# Patient Record
Sex: Female | Born: 1965 | Race: White | Hispanic: No | Marital: Married | Smoking: Never smoker
Health system: Southern US, Community
[De-identification: ages and names within clinical notes are randomized; demographics above are authoritative.]

## PROBLEM LIST (undated history)

## (undated) DIAGNOSIS — I4510 Unspecified right bundle-branch block: Secondary | ICD-10-CM

## (undated) HISTORY — DX: Unspecified right bundle-branch block: I45.10

---
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
## 2005-10-29 ENCOUNTER — Other Ambulatory Visit: Admission: RE | Admit: 2005-10-29 | Discharge: 2005-10-29 | Payer: Self-pay | Admitting: Gynecology

## 2005-11-05 ENCOUNTER — Ambulatory Visit (HOSPITAL_COMMUNITY): Admission: RE | Admit: 2005-11-05 | Discharge: 2005-11-05 | Payer: Self-pay | Admitting: Gynecology

## 2006-10-28 ENCOUNTER — Emergency Department (HOSPITAL_COMMUNITY): Admission: EM | Admit: 2006-10-28 | Discharge: 2006-10-28 | Payer: Self-pay | Admitting: Family Medicine

## 2013-01-15 ENCOUNTER — Ambulatory Visit (INDEPENDENT_AMBULATORY_CARE_PROVIDER_SITE_OTHER): Payer: BC Managed Care – PPO | Admitting: Obstetrics and Gynecology

## 2013-01-15 ENCOUNTER — Encounter: Payer: Self-pay | Admitting: Obstetrics and Gynecology

## 2013-01-15 VITALS — BP 100/63 | HR 79 | Ht 61.0 in | Wt 140.0 lb

## 2013-01-15 DIAGNOSIS — Z01419 Encounter for gynecological examination (general) (routine) without abnormal findings: Secondary | ICD-10-CM

## 2013-01-15 DIAGNOSIS — Z1151 Encounter for screening for human papillomavirus (HPV): Secondary | ICD-10-CM

## 2013-01-15 DIAGNOSIS — Z124 Encounter for screening for malignant neoplasm of cervix: Secondary | ICD-10-CM

## 2013-01-15 MED ORDER — BUPROPION HCL ER (XL) 300 MG PO TB24: 300.0000 mg | ORAL_TABLET | Freq: Every day | ORAL | Status: DC

## 2013-01-15 NOTE — Progress Notes (Signed)
  Subjective:     Sierra Haney is a 47 y.o. female and is here for a comprehensive physical exam. The patient reports decreased libido. She has been menopausal since 2011 and over the past year has noted a decrease in her libido. She has no desire for sexual intercourse but notices that it is causing a strain in her relationship with her husband of 30 years. Patient otherwise without complaints.   History   Social History  . Marital Status: Married    Spouse Name: N/A    Number of Children: N/A  . Years of Education: N/A   Occupational History  . Not on file.   Social History Main Topics  . Smoking status: Never Smoker   . Smokeless tobacco: Not on file  . Alcohol Use: No  . Drug Use: No  . Sexually Active: Yes -- Female partner(s)    Birth Control/ Protection: Post-menopausal   Other Topics Concern  . Not on file   Social History Narrative  . No narrative on file   History reviewed. No pertinent past medical history. History reviewed. No pertinent past surgical history. Family History  Problem Relation Age of Onset  . Hypertension Mother   . Hypertension Father   . Diabetes Father    History  Substance Use Topics  . Smoking status: Never Smoker   . Smokeless tobacco: Not on file  . Alcohol Use: No    No health maintenance topics applied.     Review of Systems A comprehensive review of systems was negative.   Objective:      GENERAL: Well-developed, well-nourished female in no acute distress.  HEENT: Normocephalic, atraumatic. Sclerae anicteric.  NECK: Supple. Normal thyroid.  LUNGS: Clear to auscultation bilaterally.  HEART: Regular rate and rhythm. BREASTS: Symmetric in size. No palpable masses or lymphadenopathy, skin changes, or nipple drainage. ABDOMEN: Soft, nontender, nondistended. No organomegaly. PELVIC: Normal external female genitalia. Vagina is pink and rugated.  Normal discharge. Normal appearing cervix. Uterus is normal in size. No adnexal mass or  tenderness. EXTREMITIES: No cyanosis, clubbing, or edema, 2+ distal pulses.    Assessment:    Healthy female exam.      Plan:    Pap smear collected Screening mammography scheduled Rx wellbutirn provided Samples of Osphena also provided RTC prn See After Visit Summary for Counseling Recommendations

## 2013-01-15 NOTE — Progress Notes (Signed)
Patient is having decrease sex drive and is currently in menopause.

## 2013-01-15 NOTE — Patient Instructions (Signed)
Preventive Care for Adults, Female A healthy lifestyle and preventive care can promote health and wellness. Preventive health guidelines for women include the following key practices.  A routine yearly physical is a good way to check with your caregiver about your health and preventive screening. It is a chance to share any concerns and updates on your health, and to receive a thorough exam.  Visit your dentist for a routine exam and preventive care every 6 months. Brush your teeth twice a day and floss once a day. Good oral hygiene prevents tooth decay and gum disease.  The frequency of eye exams is based on your age, health, family medical history, use of contact lenses, and other factors. Follow your caregiver's recommendations for frequency of eye exams.  Eat a healthy diet. Foods like vegetables, fruits, whole grains, low-fat dairy products, and lean protein foods contain the nutrients you need without too many calories. Decrease your intake of foods high in solid fats, added sugars, and salt. Eat the right amount of calories for you.Get information about a proper diet from your caregiver, if necessary.  Regular physical exercise is one of the most important things you can do for your health. Most adults should get at least 150 minutes of moderate-intensity exercise (any activity that increases your heart rate and causes you to sweat) each week. In addition, most adults need muscle-strengthening exercises on 2 or more days a week.  Maintain a healthy weight. The body mass index (BMI) is a screening tool to identify possible weight problems. It provides an estimate of body fat based on height and weight. Your caregiver can help determine your BMI, and can help you achieve or maintain a healthy weight.For adults 20 years and older:  A BMI below 18.5 is considered underweight.  A BMI of 18.5 to 24.9 is normal.  A BMI of 25 to 29.9 is considered overweight.  A BMI of 30 and above is  considered obese.  Maintain normal blood lipids and cholesterol levels by exercising and minimizing your intake of saturated fat. Eat a balanced diet with plenty of fruit and vegetables. Blood tests for lipids and cholesterol should begin at age 20 and be repeated every 5 years. If your lipid or cholesterol levels are high, you are over 50, or you are at high risk for heart disease, you may need your cholesterol levels checked more frequently.Ongoing high lipid and cholesterol levels should be treated with medicines if diet and exercise are not effective.  If you smoke, find out from your caregiver how to quit. If you do not use tobacco, do not start.  If you are pregnant, do not drink alcohol. If you are breastfeeding, be very cautious about drinking alcohol. If you are not pregnant and choose to drink alcohol, do not exceed 1 drink per day. One drink is considered to be 12 ounces (355 mL) of beer, 5 ounces (148 mL) of wine, or 1.5 ounces (44 mL) of liquor.  Avoid use of street drugs. Do not share needles with anyone. Ask for help if you need support or instructions about stopping the use of drugs.  High blood pressure causes heart disease and increases the risk of stroke. Your blood pressure should be checked at least every 1 to 2 years. Ongoing high blood pressure should be treated with medicines if weight loss and exercise are not effective.  If you are 55 to 49 years old, ask your caregiver if you should take aspirin to prevent strokes.  Diabetes   screening involves taking a blood sample to check your fasting blood sugar level. This should be done once every 3 years, after age 45, if you are within normal weight and without risk factors for diabetes. Testing should be considered at a younger age or be carried out more frequently if you are overweight and have at least 1 risk factor for diabetes.  Breast cancer screening is essential preventive care for women. You should practice "breast  self-awareness." This means understanding the normal appearance and feel of your breasts and may include breast self-examination. Any changes detected, no matter how small, should be reported to a caregiver. Women in their 20s and 30s should have a clinical breast exam (CBE) by a caregiver as part of a regular health exam every 1 to 3 years. After age 40, women should have a CBE every year. Starting at age 40, women should consider having a mammography (breast X-ray test) every year. Women who have a family history of breast cancer should talk to their caregiver about genetic screening. Women at a high risk of breast cancer should talk to their caregivers about having magnetic resonance imaging (MRI) and a mammography every year.  The Pap test is a screening test for cervical cancer. A Pap test can show cell changes on the cervix that might become cervical cancer if left untreated. A Pap test is a procedure in which cells are obtained and examined from the lower end of the uterus (cervix).  Women should have a Pap test starting at age 21.  Between ages 21 and 29, Pap tests should be repeated every 2 years.  Beginning at age 30, you should have a Pap test every 3 years as long as the past 3 Pap tests have been normal.  Some women have medical problems that increase the chance of getting cervical cancer. Talk to your caregiver about these problems. It is especially important to talk to your caregiver if a new problem develops soon after your last Pap test. In these cases, your caregiver may recommend more frequent screening and Pap tests.  The above recommendations are the same for women who have or have not gotten the vaccine for human papillomavirus (HPV).  If you had a hysterectomy for a problem that was not cancer or a condition that could lead to cancer, then you no longer need Pap tests. Even if you no longer need a Pap test, a regular exam is a good idea to make sure no other problems are  starting.  If you are between ages 65 and 70, and you have had normal Pap tests going back 10 years, you no longer need Pap tests. Even if you no longer need a Pap test, a regular exam is a good idea to make sure no other problems are starting.  If you have had past treatment for cervical cancer or a condition that could lead to cancer, you need Pap tests and screening for cancer for at least 20 years after your treatment.  If Pap tests have been discontinued, risk factors (such as a new sexual partner) need to be reassessed to determine if screening should be resumed.  The HPV test is an additional test that may be used for cervical cancer screening. The HPV test looks for the virus that can cause the cell changes on the cervix. The cells collected during the Pap test can be tested for HPV. The HPV test could be used to screen women aged 30 years and older, and should   be used in women of any age who have unclear Pap test results. After the age of 30, women should have HPV testing at the same frequency as a Pap test.  Colorectal cancer can be detected and often prevented. Most routine colorectal cancer screening begins at the age of 50 and continues through age 75. However, your caregiver may recommend screening at an earlier age if you have risk factors for colon cancer. On a yearly basis, your caregiver may provide home test kits to check for hidden blood in the stool. Use of a small camera at the end of a tube, to directly examine the colon (sigmoidoscopy or colonoscopy), can detect the earliest forms of colorectal cancer. Talk to your caregiver about this at age 50, when routine screening begins. Direct examination of the colon should be repeated every 5 to 10 years through age 75, unless early forms of pre-cancerous polyps or small growths are found.  Hepatitis C blood testing is recommended for all people born from 1945 through 1965 and any individual with known risks for hepatitis C.  Practice  safe sex. Use condoms and avoid high-risk sexual practices to reduce the spread of sexually transmitted infections (STIs). STIs include gonorrhea, chlamydia, syphilis, trichomonas, herpes, HPV, and human immunodeficiency virus (HIV). Herpes, HIV, and HPV are viral illnesses that have no cure. They can result in disability, cancer, and death. Sexually active women aged 25 and younger should be checked for chlamydia. Older women with new or multiple partners should also be tested for chlamydia. Testing for other STIs is recommended if you are sexually active and at increased risk.  Osteoporosis is a disease in which the bones lose minerals and strength with aging. This can result in serious bone fractures. The risk of osteoporosis can be identified using a bone density scan. Women ages 65 and over and women at risk for fractures or osteoporosis should discuss screening with their caregivers. Ask your caregiver whether you should take a calcium supplement or vitamin D to reduce the rate of osteoporosis.  Menopause can be associated with physical symptoms and risks. Hormone replacement therapy is available to decrease symptoms and risks. You should talk to your caregiver about whether hormone replacement therapy is right for you.  Use sunscreen with sun protection factor (SPF) of 30 or more. Apply sunscreen liberally and repeatedly throughout the day. You should seek shade when your shadow is shorter than you. Protect yourself by wearing long sleeves, pants, a wide-brimmed hat, and sunglasses year round, whenever you are outdoors.  Once a month, do a whole body skin exam, using a mirror to look at the skin on your back. Notify your caregiver of new moles, moles that have irregular borders, moles that are larger than a pencil eraser, or moles that have changed in shape or color.  Stay current with required immunizations.  Influenza. You need a dose every fall (or winter). The composition of the flu vaccine  changes each year, so being vaccinated once is not enough.  Pneumococcal polysaccharide. You need 1 to 2 doses if you smoke cigarettes or if you have certain chronic medical conditions. You need 1 dose at age 65 (or older) if you have never been vaccinated.  Tetanus, diphtheria, pertussis (Tdap, Td). Get 1 dose of Tdap vaccine if you are younger than age 65, are over 65 and have contact with an infant, are a healthcare worker, are pregnant, or simply want to be protected from whooping cough. After that, you need a Td   booster dose every 10 years. Consult your caregiver if you have not had at least 3 tetanus and diphtheria-containing shots sometime in your life or have a deep or dirty wound.  HPV. You need this vaccine if you are a woman age 26 or younger. The vaccine is given in 3 doses over 6 months.  Measles, mumps, rubella (MMR). You need at least 1 dose of MMR if you were born in 1957 or later. You may also need a second dose.  Meningococcal. If you are age 19 to 21 and a first-year college student living in a residence hall, or have one of several medical conditions, you need to get vaccinated against meningococcal disease. You may also need additional booster doses.  Zoster (shingles). If you are age 60 or older, you should get this vaccine.  Varicella (chickenpox). If you have never had chickenpox or you were vaccinated but received only 1 dose, talk to your caregiver to find out if you need this vaccine.  Hepatitis A. You need this vaccine if you have a specific risk factor for hepatitis A virus infection or you simply wish to be protected from this disease. The vaccine is usually given as 2 doses, 6 to 18 months apart.  Hepatitis B. You need this vaccine if you have a specific risk factor for hepatitis B virus infection or you simply wish to be protected from this disease. The vaccine is given in 3 doses, usually over 6 months. Preventive Services / Frequency Ages 19 to 39  Blood  pressure check.** / Every 1 to 2 years.  Lipid and cholesterol check.** / Every 5 years beginning at age 20.  Clinical breast exam.** / Every 3 years for women in their 20s and 30s.  Pap test.** / Every 2 years from ages 21 through 29. Every 3 years starting at age 30 through age 65 or 70 with a history of 3 consecutive normal Pap tests.  HPV screening.** / Every 3 years from ages 30 through ages 65 to 70 with a history of 3 consecutive normal Pap tests.  Hepatitis C blood test.** / For any individual with known risks for hepatitis C.  Skin self-exam. / Monthly.  Influenza immunization.** / Every year.  Pneumococcal polysaccharide immunization.** / 1 to 2 doses if you smoke cigarettes or if you have certain chronic medical conditions.  Tetanus, diphtheria, pertussis (Tdap, Td) immunization. / A one-time dose of Tdap vaccine. After that, you need a Td booster dose every 10 years.  HPV immunization. / 3 doses over 6 months, if you are 26 and younger.  Measles, mumps, rubella (MMR) immunization. / You need at least 1 dose of MMR if you were born in 1957 or later. You may also need a second dose.  Meningococcal immunization. / 1 dose if you are age 19 to 21 and a first-year college student living in a residence hall, or have one of several medical conditions, you need to get vaccinated against meningococcal disease. You may also need additional booster doses.  Varicella immunization.** / Consult your caregiver.  Hepatitis A immunization.** / Consult your caregiver. 2 doses, 6 to 18 months apart.  Hepatitis B immunization.** / Consult your caregiver. 3 doses usually over 6 months. Ages 40 to 64  Blood pressure check.** / Every 1 to 2 years.  Lipid and cholesterol check.** / Every 5 years beginning at age 20.  Clinical breast exam.** / Every year after age 40.  Mammogram.** / Every year beginning at age 40   and continuing for as long as you are in good health. Consult with your  caregiver.  Pap test.** / Every 3 years starting at age 30 through age 65 or 70 with a history of 3 consecutive normal Pap tests.  HPV screening.** / Every 3 years from ages 30 through ages 65 to 70 with a history of 3 consecutive normal Pap tests.  Fecal occult blood test (FOBT) of stool. / Every year beginning at age 50 and continuing until age 75. You may not need to do this test if you get a colonoscopy every 10 years.  Flexible sigmoidoscopy or colonoscopy.** / Every 5 years for a flexible sigmoidoscopy or every 10 years for a colonoscopy beginning at age 50 and continuing until age 75.  Hepatitis C blood test.** / For all people born from 1945 through 1965 and any individual with known risks for hepatitis C.  Skin self-exam. / Monthly.  Influenza immunization.** / Every year.  Pneumococcal polysaccharide immunization.** / 1 to 2 doses if you smoke cigarettes or if you have certain chronic medical conditions.  Tetanus, diphtheria, pertussis (Tdap, Td) immunization.** / A one-time dose of Tdap vaccine. After that, you need a Td booster dose every 10 years.  Measles, mumps, rubella (MMR) immunization. / You need at least 1 dose of MMR if you were born in 1957 or later. You may also need a second dose.  Varicella immunization.** / Consult your caregiver.  Meningococcal immunization.** / Consult your caregiver.  Hepatitis A immunization.** / Consult your caregiver. 2 doses, 6 to 18 months apart.  Hepatitis B immunization.** / Consult your caregiver. 3 doses, usually over 6 months. Ages 65 and over  Blood pressure check.** / Every 1 to 2 years.  Lipid and cholesterol check.** / Every 5 years beginning at age 20.  Clinical breast exam.** / Every year after age 40.  Mammogram.** / Every year beginning at age 40 and continuing for as long as you are in good health. Consult with your caregiver.  Pap test.** / Every 3 years starting at age 30 through age 65 or 70 with a 3  consecutive normal Pap tests. Testing can be stopped between 65 and 70 with 3 consecutive normal Pap tests and no abnormal Pap or HPV tests in the past 10 years.  HPV screening.** / Every 3 years from ages 30 through ages 65 or 70 with a history of 3 consecutive normal Pap tests. Testing can be stopped between 65 and 70 with 3 consecutive normal Pap tests and no abnormal Pap or HPV tests in the past 10 years.  Fecal occult blood test (FOBT) of stool. / Every year beginning at age 50 and continuing until age 75. You may not need to do this test if you get a colonoscopy every 10 years.  Flexible sigmoidoscopy or colonoscopy.** / Every 5 years for a flexible sigmoidoscopy or every 10 years for a colonoscopy beginning at age 50 and continuing until age 75.  Hepatitis C blood test.** / For all people born from 1945 through 1965 and any individual with known risks for hepatitis C.  Osteoporosis screening.** / A one-time screening for women ages 65 and over and women at risk for fractures or osteoporosis.  Skin self-exam. / Monthly.  Influenza immunization.** / Every year.  Pneumococcal polysaccharide immunization.** / 1 dose at age 65 (or older) if you have never been vaccinated.  Tetanus, diphtheria, pertussis (Tdap, Td) immunization. / A one-time dose of Tdap vaccine if you are over   65 and have contact with an infant, are a healthcare worker, or simply want to be protected from whooping cough. After that, you need a Td booster dose every 10 years.  Varicella immunization.** / Consult your caregiver.  Meningococcal immunization.** / Consult your caregiver.  Hepatitis A immunization.** / Consult your caregiver. 2 doses, 6 to 18 months apart.  Hepatitis B immunization.** / Check with your caregiver. 3 doses, usually over 6 months. ** Family history and personal history of risk and conditions may change your caregiver's recommendations. Document Released: 07/31/2001 Document Revised: 08/27/2011  Document Reviewed: 10/30/2010 [employer] Patient Information 2014 [employer].  

## 2013-02-08 ENCOUNTER — Other Ambulatory Visit: Payer: Self-pay | Admitting: Obstetrics and Gynecology

## 2013-04-16 ENCOUNTER — Encounter (HOSPITAL_COMMUNITY): Payer: Self-pay | Admitting: Emergency Medicine

## 2013-04-16 ENCOUNTER — Emergency Department (HOSPITAL_COMMUNITY)
Admission: EM | Admit: 2013-04-16 | Discharge: 2013-04-16 | Disposition: A | Discharge status: Nursing Home (Includes ICF) | Payer: BC Managed Care – PPO | Source: Home / Self Care

## 2013-04-16 ENCOUNTER — Observation Stay (HOSPITAL_COMMUNITY)
Admission: EM | Admit: 2013-04-16 | Discharge: 2013-04-17 | Disposition: A | Discharge status: Home / Self Care | Payer: BC Managed Care – PPO | Attending: Cardiology | Admitting: Cardiology

## 2013-04-16 ENCOUNTER — Emergency Department (HOSPITAL_COMMUNITY): Payer: BC Managed Care – PPO

## 2013-04-16 DIAGNOSIS — IMO0001 Reserved for inherently not codable concepts without codable children: Secondary | ICD-10-CM | POA: Insufficient documentation

## 2013-04-16 DIAGNOSIS — R509 Fever, unspecified: Secondary | ICD-10-CM | POA: Insufficient documentation

## 2013-04-16 DIAGNOSIS — R5381 Other malaise: Secondary | ICD-10-CM | POA: Insufficient documentation

## 2013-04-16 DIAGNOSIS — R0789 Other chest pain: Principal | ICD-10-CM | POA: Insufficient documentation

## 2013-04-16 DIAGNOSIS — J069 Acute upper respiratory infection, unspecified: Secondary | ICD-10-CM

## 2013-04-16 DIAGNOSIS — R059 Cough, unspecified: Secondary | ICD-10-CM | POA: Insufficient documentation

## 2013-04-16 DIAGNOSIS — R05 Cough: Secondary | ICD-10-CM | POA: Insufficient documentation

## 2013-04-16 DIAGNOSIS — I4510 Unspecified right bundle-branch block: Secondary | ICD-10-CM

## 2013-04-16 DIAGNOSIS — R079 Chest pain, unspecified: Secondary | ICD-10-CM

## 2013-04-16 DIAGNOSIS — J3489 Other specified disorders of nose and nasal sinuses: Secondary | ICD-10-CM | POA: Insufficient documentation

## 2013-04-16 LAB — CBC
HCT: 44.1 % (ref 36.0–46.0)
Hemoglobin: 15.3 g/dL — ABNORMAL HIGH (ref 12.0–15.0)
Platelets: 163 10*3/uL (ref 150–400)
RBC: 5.15 MIL/uL — ABNORMAL HIGH (ref 3.87–5.11)
WBC: 6.9 10*3/uL (ref 4.0–10.5)

## 2013-04-16 LAB — COMPREHENSIVE METABOLIC PANEL
Alkaline Phosphatase: 66 U/L (ref 39–117)
CO2: 25 mEq/L (ref 19–32)
Creatinine, Ser: 0.56 mg/dL (ref 0.50–1.10)
GFR calc Af Amer: >90 mL/min (ref >90)
Potassium: 4.3 mEq/L (ref 3.5–5.1)
Sodium: 139 mEq/L (ref 135–145)
Total Bilirubin: 0.3 mg/dL (ref 0.3–1.2)
Total Protein: 7.3 g/dL (ref 6.0–8.3)

## 2013-04-16 LAB — POCT I-STAT TROPONIN I: Troponin i, poc: 0 ng/mL (ref 0.00–0.08)

## 2013-04-16 MED ORDER — SODIUM CHLORIDE 0.9 % IV SOLN: 1000.0000 mL | INTRAVENOUS | Status: DC

## 2013-04-16 MED ORDER — ACETAMINOPHEN 325 MG PO TABS: 650.0000 mg | ORAL_TABLET | ORAL | Status: DC | PRN

## 2013-04-16 MED ORDER — SODIUM CHLORIDE 0.9 % IV SOLN: 250.0000 mL | INTRAVENOUS | Status: DC | PRN

## 2013-04-16 MED ORDER — SODIUM CHLORIDE 0.9 % IV SOLN
Freq: Once | INTRAVENOUS | Status: AC
  Administered 2013-04-16: 12:00:00 via INTRAVENOUS

## 2013-04-16 MED ORDER — ASPIRIN EC 81 MG PO TBEC
81.0000 mg | DELAYED_RELEASE_TABLET | Freq: Every day | ORAL | Status: DC
  Filled 2013-04-16: qty 1

## 2013-04-16 MED ORDER — SODIUM CHLORIDE 0.9 % IJ SOLN
3.0000 mL | Freq: Two times a day (BID) | INTRAMUSCULAR | Status: DC
  Administered 2013-04-16: 3 mL via INTRAVENOUS

## 2013-04-16 MED ORDER — ZOLPIDEM TARTRATE 5 MG PO TABS: 5.0000 mg | ORAL_TABLET | Freq: Every evening | ORAL | Status: DC | PRN

## 2013-04-16 MED ORDER — ALPRAZOLAM 0.25 MG PO TABS: 0.2500 mg | ORAL_TABLET | Freq: Two times a day (BID) | ORAL | Status: DC | PRN

## 2013-04-16 MED ORDER — NITROGLYCERIN 0.4 MG SL SUBL: 0.4000 mg | SUBLINGUAL_TABLET | SUBLINGUAL | Status: DC | PRN

## 2013-04-16 MED ORDER — HEPARIN SODIUM (PORCINE) 5000 UNIT/ML IJ SOLN
5000.0000 [IU] | Freq: Three times a day (TID) | INTRAMUSCULAR | Status: DC
  Filled 2013-04-16 (×5): qty 1

## 2013-04-16 MED ORDER — SODIUM CHLORIDE 0.9 % IJ SOLN: 3.0000 mL | INTRAMUSCULAR | Status: DC | PRN

## 2013-04-16 MED ORDER — ASPIRIN 81 MG PO CHEW
324.0000 mg | CHEWABLE_TABLET | Freq: Once | ORAL | Status: AC
  Administered 2013-04-16: 324 mg via ORAL
  Filled 2013-04-16: qty 4

## 2013-04-16 MED ORDER — ONDANSETRON HCL 4 MG/2ML IJ SOLN: 4.0000 mg | Freq: Four times a day (QID) | INTRAMUSCULAR | Status: DC | PRN

## 2013-04-16 MED ORDER — BUPROPION HCL ER (XL) 300 MG PO TB24
300.0000 mg | ORAL_TABLET | Freq: Every day | ORAL | Status: DC
  Filled 2013-04-16 (×2): qty 1

## 2013-04-16 NOTE — ED Notes (Signed)
Lab at bedside to redraw I stat troponin.

## 2013-04-16 NOTE — ED Notes (Signed)
Pt eating dinner.  Will transport to 3W momentarily.

## 2013-04-16 NOTE — ED Notes (Signed)
Placed    On cardiac  Monitor  Nasal  o2  At  2  l  /  min 

## 2013-04-16 NOTE — ED Provider Notes (Signed)
CSN: [id number]     Arrival date & time 04/16/13  1223 History   First MD Initiated Contact with Patient 04/16/13 1233     Chief Complaint  Patient presents with  . Chest Pain    HPI Pt started having body aches, nasal congestion, fever and myalgias that started 2 days ago. 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   She has had a dry cough.  She went to the urgent care today and was sent down to the ED because she had mentioned chest pressure with her symptoms.  Her chest feels like an intermittent pressure like an elephant sitting on her chest.  That has been ongoing for months but she is not really sure.  She notices it when she gets on the treadmill and it increases with exertion but she does not have to stop.  She did have some earlier today but it has been a daily thing at this point for a few months and maybe even longer.  Today however, with the first time she pain while at rest.  No pain now. No history of heart problems, no HTN.  No diabetes.  NO smoking.   History reviewed. No pertinent past medical history. History reviewed. No pertinent past surgical history. Family History  Problem Relation Age of Onset  . Hypertension Mother   . Hypertension Father   . Diabetes Father   . Asthma Mother   . Cancer Father   . Heart attack Mother     age 67  . Hypertension Sister    History  Substance Use Topics  . Smoking status: Never Smoker   . Smokeless tobacco: Not on file  . Alcohol Use: No   OB History   Grav Para Term Preterm Abortions TAB SAB Ect Mult Living                 Review of Systems  All other systems reviewed and are negative.    Allergies  Review of patient's allergies indicates no known allergies.  Home Medications   Current Outpatient Rx  Name  Route  Sig  Dispense  Refill  . acetaminophen (TYLENOL) 500 MG tablet   Oral   Take 1,000 mg by mouth every 6 (six) hours as needed for pain.         . Chlorphen-Phenyleph-ASA (ALKA-SELTZER PLUS  COLD PO)   Oral   Take 2 tablets by mouth 2 (two) times daily as needed (cold symptoms).         . Pseudoeph-Doxylamine-DM-APAP (NYQUIL PO)   Oral   Take 15 mLs by mouth at bedtime as needed (for cold symptoms).          BP 114/70  Pulse 71  Temp(Src) 98.6 F (37 C) (Oral)  Resp 20  Ht 5\' 1"  (1.549 m)  Wt 131 lb (59.421 kg)  BMI 24.76 kg/m2  SpO2 99%  LMP 08/16/2009 Physical Exam  Nursing note and vitals reviewed. Constitutional: She appears well-developed and well-nourished. No distress.  HENT:  Head: Normocephalic and atraumatic.  Right Ear: External ear normal.  Left Ear: External ear normal.  Eyes: Conjunctivae are normal. Right eye exhibits no discharge. Left eye exhibits no discharge. No scleral icterus.  Neck: Neck supple. No tracheal deviation present.  Cardiovascular: Normal rate, regular rhythm and intact distal pulses.   Pulmonary/Chest: Effort normal and breath sounds normal. No stridor. No respiratory distress. She has no wheezes. She has no rales.  Abdominal: Soft. Bowel sounds are  normal. She exhibits no distension. There is no tenderness. There is no rebound and no guarding.  Musculoskeletal: She exhibits no edema and no tenderness.  Neurological: She is alert. She has normal strength. No sensory deficit. Cranial nerve deficit:  no gross defecits noted. She exhibits normal muscle tone. She displays no seizure activity. Coordination normal.  Skin: Skin is warm and dry. No rash noted.  Psychiatric: She has a normal mood and affect.    ED Course  Procedures (including critical care time) Labs Review Labs Reviewed  CBC - Abnormal; Notable for the following:    RBC 5.15 (*)    Hemoglobin 15.3 (*)    All other components within normal limits  COMPREHENSIVE METABOLIC PANEL  TROPONIN I  TROPONIN I  TROPONIN I  TSH  T4, FREE  HEMOGLOBIN A1C  BASIC METABOLIC PANEL  LIPID PANEL  POCT I-STAT TROPONIN I   Imaging Review CXR interpretation  reviewed  EKG Interpretation     Ventricular Rate:  68 PR Interval:  128 QRS Duration: 137 QT Interval:  435 QTC Calculation: 463 R Axis:   99 Text Interpretation:  Sinus rhythm RBBB and LPFB No significant change since last tracing            MDM   1. Chest tightness or pressure   2. Chest pain      The patient's URI symptoms are most likely related to a viral infection.  She has however mentioned symptoms that could be related to exercise induced angina in that she has chest pressure with exercise.  The episode on the day of her evaluation occurred at rest.  Plan was to consult cardiology regarding her chest pressure complaint ie stress testing.   Celene Kras, MD 04/18/13 [phone]

## 2013-04-16 NOTE — ED Notes (Addendum)
Pt  Reports    Symptoms  Of body  Aches    To  Include  Chills        And  Cough x  2  Days    She  Reports  The  Cough is     Non  Productive       She  Is  sittingupright on  Exam table  Speaking in  Complete  sentances      Pt   Also  Reports  A  Sensation of  heavyness  In  Her  chest

## 2013-04-16 NOTE — H&P (Signed)
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 During a review of systems patient reported 1-2 years of anterior chest heaviness, "like an elephant sitting on my chest". She experiences this everyday during exertion at work and it lasts approx 2-3 minutes. Nothing seems to makes it better or worse. She states that she "just ignores it and it eventually goes away."  She denies diaphoresis, shortness of breath, dyspnea, pain with radiation to jaw or arm, dizziness, lightheadedness, syncope, palpitations, nausea, GI upset, heartburn or abdominal pain. Pain is not positional or worse with inspiration. She is a nonsmoker and does not drink or take any drugs. The only medications she is on are Nyquil and Leota Sauers for current symptoms. She has a family history of htn in her mother and father. Her mother died at 52 from MI.  She had an EKG at the urgent care showing a RBBB. In the setting of recurring chest pain the NP urged her to go to the ED.   Allergies:   No Known Allergies    Home Medications  Prior to Admission medications   Medication Sig Start Date End Date Taking? Authorizing Provider  acetaminophen (TYLENOL) 500 MG tablet Take 1,000 mg by mouth every 6 (six) hours as needed for pain.   Yes Historical Provider, MD  Chlorphen-Phenyleph-ASA (ALKA-SELTZER PLUS COLD PO) Take 2 tablets by mouth 2 (two) times daily as needed (cold symptoms).   Yes Historical Provider, MD  Pseudoeph-Doxylamine-DM-APAP (NYQUIL PO) Take 15 mLs by mouth at bedtime as needed (for cold symptoms).   Yes Historical Provider, MD    Family History  Family History  Problem  Relation Age of Onset  . Hypertension Mother   . Hypertension Father   . Diabetes Father   . Asthma Mother   . Cancer Father   . Heart attack Mother     age 58  . Hypertension Sister     Social History  History   Social History  . Marital Status: Married    Spouse Name: Max    Number of Children: 1  . Years of Education: N/A   Occupational History  . kindergarden teacher [employer]   Social History Main Topics  . Smoking status: Never Smoker   . Smokeless tobacco: Not on file  . Alcohol Use: No  . Drug Use: No  . Sexual Activity: Yes    Partners: Male    Birth Control/ Protection: Post-menopausal   Other Topics Concern  . Not on file   Social History Narrative  . No narrative on file     Review of Systems General:  No chills, + fever, night sweats or  20lb intentional weight loss with diet and exercise.  Cardiovascular:  No chest pain, dyspnea on exertion, edema, orthopnea, palpitations, paroxysmal nocturnal dyspnea. Dermatological: No rash, lesions/masses Respiratory: + cough, No dyspnea Urologic: No hematuria, dysuria Abdominal:   No nausea, vomiting, diarrhea, bright red blood per rectum, melena, or hematemesis Neurologic:  No visual changes, wkns, changes in mental status. All other systems reviewed and are otherwise negative except as noted above.  Physical Exam  Blood pressure 123/71, pulse 75, temperature 97.7 F (36.5 C), temperature  source Oral, resp. rate 14, height 5\' 1"  (1.549 m), weight 131 lb (59.421 kg), last menstrual period 08/16/2009, SpO2 98.00%.  General: Pleasant, NAD Psych: Normal affect. Neuro: Alert and oriented X 3. Moves all extremities spontaneously. HEENT: Normal  Neck: Supple without bruits or JVD. Lungs:  Resp regular and unlabored, CTA. Heart: RRR no s3, s4, or murmurs. Abdomen: Soft, non-tender, non-distended, BS + x 4.  Extremities: No clubbing, cyanosis or edema. DP/PT/Radials 2+ and equal  bilaterally.  Labs  No results found for this basename: CKTOTAL, CKMB, TROPONINI,  in the last 72 hours Lab Results  Component Value Date   WBC 6.9 04/16/2013   HGB 15.3* 04/16/2013   HCT 44.1 04/16/2013   MCV 85.6 04/16/2013   PLT 163 04/16/2013    Recent Labs Lab 04/16/13 1307  NA 139  K 4.3  CL 103  CO2 25  BUN 19  CREATININE 0.56  CALCIUM 9.1  PROT 7.3  BILITOT 0.3  ALKPHOS 66  ALT 15  AST 28  GLUCOSE 88   No results found for this basename: CHOL, HDL, LDLCALC, TRIG   No results found for this basename: DDIMER     Radiology/Studies  Dg Chest 2 View  04/16/2013   CLINICAL DATA:  Chest pain and shortness of Breath.  EXAM: CHEST  2 VIEW  COMPARISON:  None.  FINDINGS: The heart size and mediastinal contours are within normal limits. Both lungs are clear. The visualized skeletal structures are unremarkable.  IMPRESSION: No active cardiopulmonary disease.   Electronically Signed   By: Loralie Champagne M.D.   On: 04/16/2013 14:43    ECG  Abnormal ECG 84mm/s 50mm/mV 100Hz  8.0.1 12SL 241 HD CID: 1 Referred by: PRTOCOL Unconfirmed Vent. rate 63 BPM PR interval 128 ms QRS duration 132 ms QT/QTc 428/437 ms P-R-T axes 67 90 60  READING: Normal sinus rhythm Right bundle branch block  ASSESSMENT AND PLAN  Sierra Haney is a 47 yo female with no past medical history being evaluated for chest pain on exertion.   1. Chest pain on exertion and incidental RBBB on EKG. First troponin negative. Her mother died of a heart attack in her early 17s.. Patient is not diabetic. She is not aware of her cholesterol status. She is a nonsmoker.  2. Cough/cold- symptomatic care    Signed, Thereasa Parkin, PA-C 04/16/2013, 5:00 PM Patient was examined in ER. Presently painfree. She has never previously had an EKG so the RBBB is of uncertain age. Her pain is somewhat atypical and she tends to make light of it as we discuss her symptoms tonight. She has reasonable exercise tolerance  by history despite her mild chest discomfort. Her physical exam reveals clear lungs and normal cardiac findings. No chest wall tenderness. Abdomen is nontender. No palpable masses. Extremities reveal no phlebitis or calf tenderness.  We will admit to observation status, check serial troponins, check fasting lipids in am. If enzymes remain negative will get stress myoview and 2D echo in am and plan for afternoon discharge if tests are negative.

## 2013-04-16 NOTE — ED Provider Notes (Addendum)
CSN: [id number]     Arrival date & time 04/16/13  1012 History   First MD Initiated Contact with Patient 04/16/13 1036     Chief Complaint  Patient presents with  . 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 Early on she had temp of 100.9. She denies shortness of breath but complains of anterior chest heaviness, "like an elephant sitting on my chest". Denies earache, sore throat or dyspnea. She is a nonsmoker. No hx of cardiopulmonary disease or HTN. No FMH of early sudden death or early CAD. Pt exercises but now with chest pain.   History reviewed. No pertinent past medical history. History reviewed. No pertinent past surgical history. Family History  Problem Relation Age of Onset  . Hypertension Mother   . Hypertension Father   . Diabetes Father    History  Substance Use Topics  . Smoking status: Never Smoker   . Smokeless tobacco: Not on file  . Alcohol Use: No   OB History   Grav Para Term Preterm Abortions TAB SAB Ect Mult Living                 Review of Systems  Constitutional: Negative for fever, chills, activity change, appetite change and fatigue.  HENT: Positive for congestion and rhinorrhea. Negative for facial swelling, postnasal drip, sore throat and trouble swallowing.   Eyes: Negative.   Respiratory: Positive for cough and chest tightness. Negative for shortness of breath and wheezing.   Cardiovascular: Negative.  Negative for palpitations.  Gastrointestinal: Negative.   Musculoskeletal: Negative for neck pain and neck stiffness.  Skin: Negative for pallor and rash.  Neurological: Negative.     Allergies  Review of patient's allergies indicates no known allergies.  Home Medications   Current Outpatient Rx  Name  Route  Sig  Dispense  Refill  . buPROPion (WELLBUTRIN  XL) 300 MG 24 hr tablet      TAKE 1 TABLET BY MOUTH ONCE DAILY   30 tablet   0   . ibuprofen (ADVIL,MOTRIN) 800 MG tablet   Oral   Take 800 mg by mouth every 8 (eight) hours as needed for pain.          BP 130/78  Pulse 76  Temp(Src) 99 F (37.2 C) (Oral)  Resp 16  SpO2 99%  LMP 08/16/2009 Physical Exam  Nursing note and vitals reviewed. Constitutional: She is oriented to person, place, and time. She appears well-developed and well-nourished. No distress.  Appears generally well, in no acute distress no apparent respiratory difficulty and no cough during the interview.  HENT:  Mouth/Throat: Oropharynx is clear and moist. No oropharyngeal exudate.  Left TM is normal. 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  Eyes: Conjunctivae and EOM are normal.  Neck: Normal range of motion. Neck supple.  Cardiovascular: Normal rate, regular rhythm and normal heart sounds.   Pulmonary/Chest: Effort normal and breath sounds normal. No respiratory distress. She has no wheezes. She has no rales.  Abdominal: Soft. Bowel sounds are normal. She exhibits no distension. There is no tenderness.  Musculoskeletal: Normal range of motion. She exhibits no edema.  Lymphadenopathy:    She has no cervical adenopathy.  Neurological: She is alert and oriented to person, place, and time.  Skin: Skin is warm and dry. No rash noted.  Psychiatric: She has a normal mood and affect.  ED Course  Procedures (including critical care time) Labs Review Labs Reviewed - No data to display Imaging Review No results found.  EKG: RBBB. No ectopy. NSR with VR 63.  MDM   1. Chest heaviness   2. Right bundle branch block   3. URI (upper respiratory infection)    Presented to [facility] for URI sx's and on ROS found to have heaviness to chest "like an elephant sitting on my chest" and worse while on the treadmill. Post EKG I spoke with the pt and she st her chest discomfort had abated.  Transfer to   ED via [facility] for exertional heaviness across anterior chest and newly discovered RBBB on EKG.       Hayden Rasmussen, NP 04/16/13 1125  Hayden Rasmussen, NP 04/16/13 1127  Hayden Rasmussen, NP 04/23/13 [phone]

## 2013-04-16 NOTE — ED Notes (Signed)
Pt to [facility] for eval of URI sx. Transfer to [facility] via [facility] for Chest pressure that has been going on for "a long time."  Pt cannot define "a long time."  Pain is generalized in the chest and it feels like an elephant is sitting on her chest.  Pain worsens with exertion, e.g. on the treadmill.  Stuffy nose and body aches x2d.  Temp of 100.9 at home.

## 2013-04-16 NOTE — ED Notes (Signed)
Heart healthy dinner tray ordered for pt. 

## 2013-04-16 NOTE — Progress Notes (Signed)
Unit CM UR Completed by [facility] ED CM  W. Luke Rigsbee RN  

## 2013-04-16 NOTE — ED Notes (Signed)
[facility]  In  Report  Given to  Pt   [facility]

## 2013-04-17 ENCOUNTER — Observation Stay (HOSPITAL_COMMUNITY): Payer: BC Managed Care – PPO

## 2013-04-17 DIAGNOSIS — R0789 Other chest pain: Secondary | ICD-10-CM | POA: Diagnosis present

## 2013-04-17 DIAGNOSIS — R079 Chest pain, unspecified: Secondary | ICD-10-CM

## 2013-04-17 LAB — LIPID PANEL
Cholesterol: 148 mg/dL (ref 0–200)
HDL: 46 mg/dL (ref >39)
LDL Cholesterol: 87 mg/dL (ref 0–99)
Total CHOL/HDL Ratio: 3.2 RATIO
Triglycerides: 75 mg/dL (ref <150)

## 2013-04-17 LAB — T4, FREE: Free T4: 0.99 ng/dL (ref 0.80–1.80)

## 2013-04-17 LAB — BASIC METABOLIC PANEL
CO2: 25 mEq/L (ref 19–32)
Calcium: 8.9 mg/dL (ref 8.4–10.5)
Chloride: 106 mEq/L (ref 96–112)
Creatinine, Ser: 0.57 mg/dL (ref 0.50–1.10)
GFR calc Af Amer: >90 mL/min (ref >90)
GFR calc non Af Amer: >90 mL/min (ref >90)
Potassium: 3.6 mEq/L (ref 3.5–5.1)
Sodium: 141 mEq/L (ref 135–145)

## 2013-04-17 LAB — HEMOGLOBIN A1C: Mean Plasma Glucose: 108 mg/dL (ref <117)

## 2013-04-17 LAB — TROPONIN I: Troponin I: <0.3 ng/mL (ref <0.30)

## 2013-04-17 MED ORDER — TECHNETIUM TC 99M SESTAMIBI GENERIC - CARDIOLITE
30.0000 | Freq: Once | INTRAVENOUS | Status: AC | PRN
  Administered 2013-04-17: 30 via INTRAVENOUS

## 2013-04-17 MED ORDER — TECHNETIUM TC 99M SESTAMIBI GENERIC - CARDIOLITE
10.0000 | Freq: Once | INTRAVENOUS | Status: AC | PRN
  Administered 2013-04-17: 10 via INTRAVENOUS

## 2013-04-17 NOTE — Progress Notes (Signed)
Exercise Cardiolite performed

## 2013-04-17 NOTE — Progress Notes (Signed)
Subjective: No chest pain at this time, it has been intermittent for his year. Objective: Filed Vitals:   04/17/13 0946 04/17/13 0947 04/17/13 0948 04/17/13 0952  BP: 148/75  157/75 156/66  Pulse: 141 146 149 130  Temp:      TempSrc:      Resp:      Height:      Weight:      SpO2:       Weight change:   Intake/Output Summary (Last 24 hours) at 04/17/13 1018 Last data filed at 04/16/13 2152  Gross per 24 hour  Intake      3 ml  Output      0 ml  Net      3 ml    General: Alert, awake, oriented x3, in no acute distress Neck:  JVP is normal Heart: Regular rate and rhythm, with soft murmur, no rubs, gallops.  Lungs: Clear to auscultation.  No rales or wheezes. Exemities:  No edema.   Neuro: Grossly intact, nonfocal.  Tele:  SR   Lab Results: Results for orders placed during the hospital encounter of 04/16/13 (from the past 24 hour(s))  CBC     Status: Abnormal   Collection Time    04/16/13  1:07 PM      Result Value Range   WBC 6.9  4.0 - 10.5 K/uL   RBC 5.15 (*) 3.87 - 5.11 MIL/uL   Hemoglobin 15.3 (*) 12.0 - 15.0 g/dL   HCT 45.4  09.8 - 11.9 %   MCV 85.6  78.0 - 100.0 fL   MCH 29.7  26.0 - 34.0 pg   MCHC 34.7  30.0 - 36.0 g/dL   RDW 14.7  82.9 - 56.2 %   Platelets 163  150 - 400 K/uL  COMPREHENSIVE METABOLIC PANEL     Status: None   Collection Time    04/16/13  1:07 PM      Result Value Range   Sodium 139  135 - 145 mEq/L   Potassium 4.3  3.5 - 5.1 mEq/L   Chloride 103  96 - 112 mEq/L   CO2 25  19 - 32 mEq/L   Glucose, Bld 88  70 - 99 mg/dL   BUN 19  6 - 23 mg/dL   Creatinine, Ser 1.30  0.50 - 1.10 mg/dL   Calcium 9.1  8.4 - 86.5 mg/dL   Total Protein 7.3  6.0 - 8.3 g/dL   Albumin 4.0  3.5 - 5.2 g/dL   AST 28  0 - 37 U/L   ALT 15  0 - 35 U/L   Alkaline Phosphatase 66  39 - 117 U/L   Total Bilirubin 0.3  0.3 - 1.2 mg/dL   GFR calc non Af Amer >90  >90 mL/min   GFR calc Af Amer >90  >90 mL/min  POCT I-STAT TROPONIN I     Status: None   Collection Time     04/16/13  3:19 PM      Result Value Range   Troponin i, poc 0.00  0.00 - 0.08 ng/mL   Comment 3           TROPONIN I     Status: None   Collection Time    04/16/13  9:42 PM      Result Value Range   Troponin I <0.30  <0.30 ng/mL  TROPONIN I     Status: None   Collection Time    04/17/13  2:30 AM  Result Value Range   Troponin I <0.30  <0.30 ng/mL  BASIC METABOLIC PANEL     Status: None   Collection Time    04/17/13  2:30 AM      Result Value Range   Sodium 141  135 - 145 mEq/L   Potassium 3.6  3.5 - 5.1 mEq/L   Chloride 106  96 - 112 mEq/L   CO2 25  19 - 32 mEq/L   Glucose, Bld 94  70 - 99 mg/dL   BUN 17  6 - 23 mg/dL   Creatinine, Ser 1.61  0.50 - 1.10 mg/dL   Calcium 8.9  8.4 - 09.6 mg/dL   GFR calc non Af Amer >90  >90 mL/min   GFR calc Af Amer >90  >90 mL/min  LIPID PANEL     Status: None   Collection Time    04/17/13  2:30 AM      Result Value Range   Cholesterol 148  0 - 200 mg/dL   Triglycerides 75  <045 mg/dL   HDL 46  >40 mg/dL   Total CHOL/HDL Ratio 3.2     VLDL 15  0 - 40 mg/dL   LDL Cholesterol 87  0 - 99 mg/dL    Studies/Results: Dg Chest 2 View  04/16/2013   CLINICAL DATA:  Chest pain and shortness of Breath.  EXAM: CHEST  2 VIEW  COMPARISON:  None.  FINDINGS: The heart size and mediastinal contours are within normal limits. Both lungs are clear. The visualized skeletal structures are unremarkable.  IMPRESSION: No active cardiopulmonary disease.   Electronically Signed   By: Loralie Champagne M.D.   On: 04/16/2013 14:43     Medications: Reviewed Scheduled Meds: . aspirin EC  81 mg Oral Daily  . buPROPion  300 mg Oral Daily  . heparin  5,000 Units Subcutaneous Q8H  . sodium chloride  3 mL Intravenous Q12H   Continuous Infusions: . sodium chloride     PRN Meds:.sodium chloride, acetaminophen, ALPRAZolam, nitroGLYCERIN, ondansetron (ZOFRAN) IV, sodium chloride, zolpidem   Principal Problem:   Chest tightness or pressure - cardiac enzymes  are negative for MI. Her ECG has a right bundle branch block. She is for an exercise Cardiolite today to assess for ischemia. Further evaluation and treatment will depend on the results.     LOS: 1 day   Theodore Demark 04/17/2013, 10:18 AM  Patient seen and examined.  Note amended to reflect my findings. Stress test today.  Dietrich Pates

## 2013-04-17 NOTE — Discharge Summary (Signed)
CARDIOLOGY DISCHARGE SUMMARY   Patient ID: Sierra Haney MRN: [id number] DOB/AGE: 1965-10-13 47 y.o.  Admit date: 04/16/2013 Discharge date: 04/17/2013  Primary Discharge Diagnosis:     Chest tightness or pressure  Procedures: GXT nuclear stress test  Hospital Course: Sierra Haney is a 47 y.o. female with no history of CAD. She has had intermittent substernal chest pressure for over a year. She told her doctor about it and her ECG was abnormal. She was sent to the ER and admitted for further evaluation and treatment.   Her cardiac enzymes were negative for MI. Her ECG showed a RBBB but no other significant abnormality. She had an exercise nuclear stress test on 04/17/2013. Her exercise capacity was very good and she had no chest pain with the exertion. Her heart rate was 100% of her predicted maximum without ischemic symptoms or ECG changes.  On 10/31, she was seen by Dr. Tenny Craw and all data were reviewed. She was considered stable for discharge, to follow up with primary care and with cardiology as needed.   Labs:   Lab Results  Component Value Date   WBC 6.9 04/16/2013   HGB 15.3* 04/16/2013   HCT 44.1 04/16/2013   MCV 85.6 04/16/2013   PLT 163 04/16/2013     Recent Labs Lab 04/16/13 1307 04/17/13 0230  NA 139 141  K 4.3 3.6  CL 103 106  CO2 25 25  BUN 19 17  CREATININE 0.56 0.57  CALCIUM 9.1 8.9  PROT 7.3  --   BILITOT 0.3  --   ALKPHOS 66  --   ALT 15  --   AST 28  --   GLUCOSE 88 94    Recent Labs  04/16/13 2142 04/17/13 0230  TROPONINI <0.30 <0.30   Lipid Panel     Component Value Date/Time   CHOL 148 04/17/2013 0230   TRIG 75 04/17/2013 0230   HDL 46 04/17/2013 0230   CHOLHDL 3.2 04/17/2013 0230   VLDL 15 04/17/2013 0230   LDLCALC 87 04/17/2013 0230     Radiology: Dg Chest 2 View 04/16/2013   CLINICAL DATA:  Chest pain and shortness of Breath.  EXAM: CHEST  2 VIEW  COMPARISON:  None.  FINDINGS: The heart size and mediastinal contours are  within normal limits. Both lungs are clear. The visualized skeletal structures are unremarkable.  IMPRESSION: No active cardiopulmonary disease.   Electronically Signed   By: Loralie Champagne M.D.   On: 04/16/2013 14:43   EKG: 17-Apr-2013 04:46:42  Normal sinus rhythm Right bundle branch block Vent. rate 61 BPM PR interval 140 ms QRS duration 130 ms QT/QTc 448/450 ms P-R-T axes 46 79 50  Exercise Cardiolite: 04/17/2013 No scar or ischemia, EF is normal  FOLLOW UP PLANS AND APPOINTMENTS No Known Allergies   Medication List         acetaminophen 500 MG tablet  Commonly known as:  TYLENOL  Take 1,000 mg by mouth every 6 (six) hours as needed for pain.     ALKA-SELTZER PLUS COLD PO  Take 2 tablets by mouth 2 (two) times daily as needed (cold symptoms).     NYQUIL PO  Take 15 mLs by mouth at bedtime as needed (for cold symptoms).        Discharge Orders   Future Orders Complete By Expires   Diet - low sodium heart healthy  As directed    Increase activity slowly  As directed      Follow-up  Information   Schedule an appointment as soon as possible for a visit with Leo Grosser, MD.   Specialty:  Family Medicine   Contact information:   [address] [place] [address] [phone]       Follow up with Cassell Clement, MD. (As needed)    Specialty:  Cardiology   Contact information:   [address] [place] [address] [phone]       BRING ALL MEDICATIONS WITH YOU TO FOLLOW UP APPOINTMENTS  Time spent with patient to include physician time: 36 min Signed: Theodore Demark, PA-C 04/17/2013, 1:56 PM Co-Sign MD

## 2013-04-19 NOTE — ED Provider Notes (Signed)
Medical screening examination/treatment/procedure(s) were performed by a resident physician or non-physician practitioner and as the supervising physician I was immediately available for consultation/collaboration.  Evan Corey, MD    Evan S Corey, MD 04/19/13 0858 

## 2013-04-23 ENCOUNTER — Other Ambulatory Visit: Payer: Self-pay

## 2013-04-24 NOTE — ED Provider Notes (Signed)
Medical screening examination/treatment/procedure(s) were performed by a resident physician or non-physician practitioner and as the supervising physician I was immediately available for consultation/collaboration.  Clementeen Graham, MD    Rodolph Bong, MD 04/24/13 [phone]

## 2014-01-15 ENCOUNTER — Ambulatory Visit (INDEPENDENT_AMBULATORY_CARE_PROVIDER_SITE_OTHER): Payer: BC Managed Care – PPO | Admitting: Obstetrics & Gynecology

## 2014-01-15 ENCOUNTER — Other Ambulatory Visit (HOSPITAL_COMMUNITY)
Admission: RE | Admit: 2014-01-15 | Discharge: 2014-01-15 | Disposition: A | Discharge status: Home / Self Care | Payer: BC Managed Care – PPO | Source: Ambulatory Visit | Attending: Obstetrics & Gynecology | Admitting: Obstetrics & Gynecology

## 2014-01-15 ENCOUNTER — Encounter: Payer: Self-pay | Admitting: Obstetrics & Gynecology

## 2014-01-15 VITALS — BP 120/70 | HR 70 | Ht 61.0 in | Wt 140.6 lb

## 2014-01-15 DIAGNOSIS — Z01419 Encounter for gynecological examination (general) (routine) without abnormal findings: Secondary | ICD-10-CM | POA: Diagnosis not present

## 2014-01-15 DIAGNOSIS — R635 Abnormal weight gain: Secondary | ICD-10-CM | POA: Diagnosis not present

## 2014-01-15 DIAGNOSIS — Z0000 Encounter for general adult medical examination without abnormal findings: Secondary | ICD-10-CM

## 2014-01-15 NOTE — Progress Notes (Signed)
Subjective:    Sierra Haney is a 48 y.o. female who presents for an annual exam. The patient has no complaints today except fo ra 10 pound weight gain over the last year. The patient is sexually active. GYN screening history: last pap: was normal. The patient wears seatbelts: yes. The patient participates in regular exercise: yes. ([employer] QOD) Has the patient ever been transfused or tattooed?: no. The patient reports that there is not domestic violence in her life.   Menstrual History: OB History   Grav Para Term Preterm Abortions TAB SAB Ect Mult Living                  Menarche age: 81  Patient's last menstrual period was 08/16/2009.    The following portions of the patient's history were reviewed and updated as appropriate: allergies, current medications, past family history, past medical history, past social history, past surgical history and problem list.  Review of Systems A comprehensive review of systems was negative. Her LMP was about 2 years ago. She has been married for 30 years. Her husband is 67 yo. Her libido has decreased. She uses lubrication with sex.   Objective:    BP 120/70  Pulse 70  Ht 5\' 1"  (1.549 m)  Wt 140 lb 9.6 oz (63.776 kg)  BMI 26.58 kg/m2  LMP 08/16/2009  General Appearance:    Alert, cooperative, no distress, appears stated age  Head:    Normocephalic, without obvious abnormality, atraumatic  Eyes:    PERRL, conjunctiva/corneas clear, EOM's intact, fundi    benign, both eyes  Ears:    Normal TM's and external ear canals, both ears  Nose:   Nares normal, septum midline, mucosa normal, no drainage    or sinus tenderness  Throat:   Lips, mucosa, and tongue normal; teeth and gums normal  Neck:   Supple, symmetrical, trachea midline, no adenopathy;    thyroid:  no enlargement/tenderness/nodules; no carotid   bruit or JVD  Back:     Symmetric, no curvature, ROM normal, no CVA tenderness  Lungs:     Clear to auscultation bilaterally, respirations  unlabored  Chest Wall:    No tenderness or deformity   Heart:    Regular rate and rhythm, S1 and S2 normal, no murmur, rub   or gallop  Breast Exam:    No tenderness, masses, or nipple abnormality  Abdomen:     Soft, non-tender, bowel sounds active all four quadrants,    no masses, no organomegaly  Genitalia:    Normal female without lesion, discharge or tenderness, moderate to severe vulvar atrophy, NSSmidplane, NT, normal adnexal exam     Extremities:   Extremities normal, atraumatic, no cyanosis or edema  Pulses:   2+ and symmetric all extremities  Skin:   Skin color, texture, turgor normal, no rashes or lesions  Lymph nodes:   Cervical, supraclavicular, and axillary nodes normal  Neurologic:   CNII-XII intact, normal strength, sensation and reflexes    throughout  .    Assessment:    Healthy female exam.  Decreased libido   Plan:     Breast self exam technique reviewed and patient encouraged to perform self-exam monthly. Mammogram. Thin prep Pap smear. with cotesting I gave her a prescription for testosterone ointment 2% to be used 3 times per week if she desires

## 2014-01-15 NOTE — Addendum Note (Signed)
Addended by: Emily Filbert on: 01/15/2014 10:44 AM   Modules accepted: Orders

## 2014-01-16 LAB — T4: T4, Total: 7.6 ug/dL (ref 5.0–12.5)

## 2014-01-16 LAB — T3 UPTAKE: T3 Uptake: 31.9 % (ref 22.5–37.0)

## 2014-01-16 LAB — TSH: TSH: 1.231 u[IU]/mL (ref 0.350–4.500)

## 2014-01-20 LAB — CYTOLOGY - PAP

## 2014-02-03 ENCOUNTER — Ambulatory Visit (HOSPITAL_COMMUNITY)
Admission: RE | Admit: 2014-02-03 | Discharge: 2014-02-03 | Disposition: A | Discharge status: Home / Self Care | Payer: BC Managed Care – PPO | Source: Ambulatory Visit | Attending: Obstetrics & Gynecology | Admitting: Obstetrics & Gynecology

## 2014-02-03 DIAGNOSIS — Z1231 Encounter for screening mammogram for malignant neoplasm of breast: Secondary | ICD-10-CM | POA: Insufficient documentation

## 2014-02-03 DIAGNOSIS — Z0000 Encounter for general adult medical examination without abnormal findings: Secondary | ICD-10-CM

## 2014-12-28 ENCOUNTER — Other Ambulatory Visit: Payer: Self-pay | Admitting: Obstetrics & Gynecology

## 2014-12-28 DIAGNOSIS — Z1231 Encounter for screening mammogram for malignant neoplasm of breast: Secondary | ICD-10-CM

## 2015-01-18 ENCOUNTER — Ambulatory Visit (INDEPENDENT_AMBULATORY_CARE_PROVIDER_SITE_OTHER): Payer: BC Managed Care – PPO | Admitting: Obstetrics & Gynecology

## 2015-01-18 ENCOUNTER — Encounter: Payer: Self-pay | Admitting: Obstetrics & Gynecology

## 2015-01-18 VITALS — BP 113/78 | HR 65 | Resp 16 | Ht 61.0 in | Wt 153.0 lb

## 2015-01-18 DIAGNOSIS — Z124 Encounter for screening for malignant neoplasm of cervix: Secondary | ICD-10-CM | POA: Diagnosis not present

## 2015-01-18 DIAGNOSIS — Z01419 Encounter for gynecological examination (general) (routine) without abnormal findings: Secondary | ICD-10-CM | POA: Diagnosis not present

## 2015-01-18 DIAGNOSIS — Z1151 Encounter for screening for human papillomavirus (HPV): Secondary | ICD-10-CM | POA: Diagnosis not present

## 2015-01-18 DIAGNOSIS — Z0000 Encounter for general adult medical examination without abnormal findings: Secondary | ICD-10-CM

## 2015-01-18 DIAGNOSIS — R6882 Decreased libido: Secondary | ICD-10-CM

## 2015-01-18 NOTE — Progress Notes (Signed)
Subjective:    Sierra Haney is a 49 y.o. MW P1 (70 yo daughter and she is raising her 3 yo grandson)  female who presents for an annual exam. The patient has no complaints today. The patient is sexually active. GYN screening history: last pap: was normal. The patient wears seatbelts: yes. The patient participates in regular exercise: yes. Has the patient ever been transfused or tattooed?: no. The patient reports that there is not domestic violence in her life.   Menstrual History: OB History    No data available      Menarche age: 20  Patient's last menstrual period was 08/16/2009.    The following portions of the patient's history were reviewed and updated as appropriate: allergies, current medications, past family history, past medical history, past social history, past surgical history and problem list.  Review of Systems A comprehensive review of systems was negative.  Rec colonoscopy. She has been menopausal for about 6 years. She has no libido and test. Ointment did not help.   Objective:    BP 113/78 mmHg  Pulse 65  Resp 16  Ht 5\' 1"  (1.549 m)  Wt 153 lb (69.4 kg)  BMI 28.92 kg/m2  LMP 08/16/2009  General Appearance:    Alert, cooperative, no distress, appears stated age  Head:    Normocephalic, without obvious abnormality, atraumatic  Eyes:    PERRL, conjunctiva/corneas clear, EOM's intact, fundi    benign, both eyes  Ears:    Normal TM's and external ear canals, both ears  Nose:   Nares normal, septum midline, mucosa normal, no drainage    or sinus tenderness  Throat:   Lips, mucosa, and tongue normal; teeth and gums normal  Neck:   Supple, symmetrical, trachea midline, no adenopathy;    thyroid:  no enlargement/tenderness/nodules; no carotid   bruit or JVD  Back:     Symmetric, no curvature, ROM normal, no CVA tenderness  Lungs:     Clear to auscultation bilaterally, respirations unlabored  Chest Wall:    No tenderness or deformity   Heart:    Regular rate and  rhythm, S1 and S2 normal, no murmur, rub   or gallop  Breast Exam:    No tenderness, masses, or nipple abnormality  Abdomen:     Soft, non-tender, bowel sounds active all four quadrants,    no masses, no organomegaly  Genitalia:    Normal female without lesion, discharge or tenderness, atrophic, NSSmidplane, NT, normal adnexal exam     Extremities:   Extremities normal, atraumatic, no cyanosis or edema  Pulses:   2+ and symmetric all extremities  Skin:   Skin color, texture, turgor normal, no rashes or lesions  Lymph nodes:   Cervical, supraclavicular, and axillary nodes normal  Neurologic:   CNII-XII intact, normal strength, sensation and reflexes    throughout  .    Assessment:    Healthy female exam.   Decreased libido   Plan:     Breast self exam technique reviewed and patient encouraged to perform self-exam monthly. Mammogram. Thin prep Pap smear. with cotesting Offered Addyi  Fasting labs at her convenience

## 2015-01-19 ENCOUNTER — Other Ambulatory Visit (INDEPENDENT_AMBULATORY_CARE_PROVIDER_SITE_OTHER): Payer: BC Managed Care – PPO | Admitting: Obstetrics & Gynecology

## 2015-01-19 DIAGNOSIS — Z01419 Encounter for gynecological examination (general) (routine) without abnormal findings: Secondary | ICD-10-CM | POA: Diagnosis not present

## 2015-01-19 LAB — CBC
HEMATOCRIT: 43.2 % (ref 36.0–46.0)
Hemoglobin: 15 g/dL (ref 12.0–15.0)
MCH: 29.7 pg (ref 26.0–34.0)
MCHC: 34.7 g/dL (ref 30.0–36.0)
MCV: 85.5 fL (ref 78.0–100.0)
MPV: 11.1 fL (ref 8.6–12.4)
Platelets: 143 10*3/uL — ABNORMAL LOW (ref 150–400)
RBC: 5.05 MIL/uL (ref 3.87–5.11)
RDW: 13.6 % (ref 11.5–15.5)
WBC: 5.1 10*3/uL (ref 4.0–10.5)

## 2015-01-19 LAB — TSH: TSH: 1.004 u[IU]/mL (ref 0.350–4.500)

## 2015-01-19 LAB — CYTOLOGY - PAP

## 2015-01-20 LAB — COMPREHENSIVE METABOLIC PANEL
ALBUMIN: 4.2 g/dL (ref 3.6–5.1)
ALT: 24 U/L (ref 6–29)
AST: 21 U/L (ref 10–35)
Alkaline Phosphatase: 61 U/L (ref 33–130)
BUN: 13 mg/dL (ref 7–25)
CO2: 22 mmol/L (ref 20–31)
Calcium: 9.2 mg/dL (ref 8.6–10.4)
Chloride: 104 mmol/L (ref 98–110)
Creat: 0.56 mg/dL (ref 0.50–1.05)
Glucose, Bld: 82 mg/dL (ref 65–99)
POTASSIUM: 4.5 mmol/L (ref 3.5–5.3)
Sodium: 141 mmol/L (ref 135–146)
Total Bilirubin: 0.6 mg/dL (ref 0.2–1.2)
Total Protein: 6.7 g/dL (ref 6.1–8.1)

## 2015-01-20 LAB — LIPID PANEL
CHOL/HDL RATIO: 3.2 ratio (ref <=5.0)
Cholesterol: 185 mg/dL (ref 125–200)
HDL: 57 mg/dL (ref >=46)
LDL CALC: 115 mg/dL (ref <130)
Triglycerides: 65 mg/dL (ref <150)
VLDL: 13 mg/dL (ref <30)

## 2015-01-20 LAB — VITAMIN D 25 HYDROXY (VIT D DEFICIENCY, FRACTURES): VIT D 25 HYDROXY: 23 ng/mL — AB (ref 30–100)

## 2015-02-08 ENCOUNTER — Ambulatory Visit (HOSPITAL_COMMUNITY)
Admission: RE | Admit: 2015-02-08 | Discharge: 2015-02-08 | Disposition: A | Discharge status: Home / Self Care | Payer: BC Managed Care – PPO | Source: Ambulatory Visit | Attending: Obstetrics & Gynecology | Admitting: Obstetrics & Gynecology

## 2015-02-08 DIAGNOSIS — Z1231 Encounter for screening mammogram for malignant neoplasm of breast: Secondary | ICD-10-CM | POA: Diagnosis present

## 2015-04-07 ENCOUNTER — Encounter (HOSPITAL_COMMUNITY): Payer: Self-pay | Admitting: Emergency Medicine

## 2015-04-07 ENCOUNTER — Emergency Department (HOSPITAL_COMMUNITY): Payer: BC Managed Care – PPO

## 2015-04-07 ENCOUNTER — Emergency Department (HOSPITAL_COMMUNITY)
Admission: EM | Admit: 2015-04-07 | Discharge: 2015-04-07 | Disposition: A | Discharge status: Home / Self Care | Payer: BC Managed Care – PPO | Attending: Emergency Medicine | Admitting: Emergency Medicine

## 2015-04-07 DIAGNOSIS — J189 Pneumonia, unspecified organism: Secondary | ICD-10-CM

## 2015-04-07 DIAGNOSIS — R079 Chest pain, unspecified: Secondary | ICD-10-CM | POA: Insufficient documentation

## 2015-04-07 DIAGNOSIS — R0602 Shortness of breath: Secondary | ICD-10-CM | POA: Insufficient documentation

## 2015-04-07 LAB — CBC
HEMATOCRIT: 41.3 % (ref 36.0–46.0)
Hemoglobin: 14.5 g/dL (ref 12.0–15.0)
MCH: 30 pg (ref 26.0–34.0)
MCHC: 35.1 g/dL (ref 30.0–36.0)
MCV: 85.3 fL (ref 78.0–100.0)
Platelets: 143 10*3/uL — ABNORMAL LOW (ref 150–400)
RBC: 4.84 MIL/uL (ref 3.87–5.11)
RDW: 12.4 % (ref 11.5–15.5)
WBC: 5.8 10*3/uL (ref 4.0–10.5)

## 2015-04-07 LAB — COMPREHENSIVE METABOLIC PANEL
ALBUMIN: 4.1 g/dL (ref 3.5–5.0)
ALK PHOS: 57 U/L (ref 38–126)
ALT: 16 U/L (ref 14–54)
ANION GAP: 7 (ref 5–15)
AST: 18 U/L (ref 15–41)
BILIRUBIN TOTAL: 0.6 mg/dL (ref 0.3–1.2)
BUN: 12 mg/dL (ref 6–20)
CALCIUM: 9 mg/dL (ref 8.9–10.3)
CO2: 25 mmol/L (ref 22–32)
Chloride: 106 mmol/L (ref 101–111)
Creatinine, Ser: 0.5 mg/dL (ref 0.44–1.00)
GFR calc Af Amer: >60 mL/min (ref >60)
GLUCOSE: 105 mg/dL — AB (ref 65–99)
Potassium: 3.6 mmol/L (ref 3.5–5.1)
Sodium: 138 mmol/L (ref 135–145)
TOTAL PROTEIN: 6.7 g/dL (ref 6.5–8.1)

## 2015-04-07 LAB — TROPONIN I
Troponin I: <0.03 ng/mL (ref <0.031)
Troponin I: <0.03 ng/mL (ref <0.031)

## 2015-04-07 MED ORDER — ASPIRIN EC 325 MG PO TBEC: 325.0000 mg | DELAYED_RELEASE_TABLET | Freq: Every day | ORAL | Status: DC

## 2015-04-07 MED ORDER — ASPIRIN 81 MG PO CHEW
324.0000 mg | CHEWABLE_TABLET | Freq: Once | ORAL | Status: AC
  Administered 2015-04-07: 324 mg via ORAL
  Filled 2015-04-07: qty 4

## 2015-04-07 MED ORDER — NITROGLYCERIN 0.4 MG SL SUBL
0.4000 mg | SUBLINGUAL_TABLET | SUBLINGUAL | Status: DC | PRN
Start: 2015-04-07 — End: 2015-04-07
  Administered 2015-04-07: 0.4 mg via SUBLINGUAL
  Filled 2015-04-07: qty 1

## 2015-04-07 MED ORDER — NITROGLYCERIN 0.4 MG SL SUBL: 0.4000 mg | SUBLINGUAL_TABLET | SUBLINGUAL | Status: DC | PRN

## 2015-04-07 MED ORDER — FENTANYL CITRATE (PF) 100 MCG/2ML IJ SOLN
50.0000 ug | Freq: Once | INTRAMUSCULAR | Status: AC
  Administered 2015-04-07: 50 ug via INTRAVENOUS
  Filled 2015-04-07: qty 2

## 2015-04-07 NOTE — ED Notes (Signed)
Per pt, states upper central chest pain and head pain which started yesterday

## 2015-04-07 NOTE — ED Notes (Signed)
Patient given NTG 0.4mg  BP decreased to 99/63.

## 2015-04-07 NOTE — ED Provider Notes (Signed)
CSN: [id number]     Arrival date & time 04/07/15  1032 History   First MD Initiated Contact with Patient 04/07/15 1036     Chief Complaint  Patient presents with  . Chest Pain     (Consider location/radiation/quality/duration/timing/severity/associated sxs/prior Treatment) Patient is a 49 y.o. female presenting with chest pain.  Chest Pain Pain location:  Substernal area Pain quality: not aching   Pain radiates to:  Does not radiate Pain radiates to the back: no   Pain severity:  Mild Timing:  Intermittent Progression:  Waxing and waning Chronicity:  New Context: movement   Context: not breathing   Relieved by:  None tried Worsened by:  Nothing tried Ineffective treatments:  None tried Associated symptoms: shortness of breath   Associated symptoms: no abdominal pain, no anxiety, no back pain, no cough, no fatigue and no fever     History reviewed. No pertinent past medical history. History reviewed. No pertinent past surgical history. Family History  Problem Relation Age of Onset  . Hypertension Mother   . Hypertension Father   . Diabetes Father   . Asthma Mother   . Cancer Father   . Heart attack Mother     age 43  . Hypertension Sister    Social History  Substance Use Topics  . Smoking status: Never Smoker   . Smokeless tobacco: None  . Alcohol Use: No   OB History    No data available     Review of Systems  Constitutional: Negative for fever and fatigue.  Respiratory: Positive for shortness of breath. Negative for cough.   Cardiovascular: Positive for chest pain.  Gastrointestinal: Negative for abdominal pain.  Endocrine: Negative for polydipsia and polyuria.  Genitourinary: Negative for enuresis.  Musculoskeletal: Negative for back pain.  All other systems reviewed and are negative.     Allergies  Review of patient's allergies indicates no known allergies.  Home Medications   Prior to Admission medications   Medication Sig Start Date End  Date Taking? Authorizing Provider  acetaminophen (TYLENOL) 500 MG tablet Take 500 mg by mouth every 6 (six) hours as needed for pain.    Yes Historical Provider, MD  naproxen sodium (ANAPROX) 220 MG tablet Take 220 mg by mouth daily as needed (pain).   Yes Historical Provider, MD  aspirin EC 325 MG tablet Take 1 tablet (325 mg total) by mouth daily. 04/07/15   Merrily Pew, MD  nitroGLYCERIN (NITROSTAT) 0.4 MG SL tablet Place 1 tablet (0.4 mg total) under the tongue every 5 (five) minutes as needed for chest pain. 04/07/15   Merrily Pew, MD   BP 117/73 mmHg  Pulse 74  Temp(Src) 98.2 F (36.8 C) (Oral)  Resp 20  SpO2 100%  LMP 08/16/2009 Physical Exam  Constitutional: She is oriented to person, place, and time. She appears well-developed and well-nourished.  HENT:  Head: Normocephalic and atraumatic.  Eyes: Conjunctivae and EOM are normal. Right eye exhibits no discharge. Left eye exhibits no discharge.  Cardiovascular: Normal rate and regular rhythm.   Pulmonary/Chest: Effort normal and breath sounds normal. No respiratory distress.  Abdominal: Soft. She exhibits no distension. There is no tenderness. There is no rebound.  Musculoskeletal: Normal range of motion. She exhibits no edema or tenderness.  Neurological: She is alert and oriented to person, place, and time.  Skin: Skin is warm and dry. No rash noted.  Nursing note and vitals reviewed.   ED Course  Procedures (including critical care time) Labs Review Labs  Reviewed  CBC - Abnormal; Notable for the following:    Platelets 143 (*)    All other components within normal limits  COMPREHENSIVE METABOLIC PANEL - Abnormal; Notable for the following:    Glucose, Bld 105 (*)    All other components within normal limits  TROPONIN I  TROPONIN I    Imaging Review Dg Chest Port 1 View  04/07/2015  CLINICAL DATA:  CP, headache, pain and blurry vision in the left eye x 2 days. EXAM: PORTABLE CHEST 1 VIEW COMPARISON:  04/17/2015  FINDINGS: Numerous leads and wires project over the chest. Midline trachea.  Normal heart size and mediastinal contours. Sharp costophrenic angles.  No pneumothorax.  Clear lungs. IMPRESSION: No active disease. Electronically Signed   By: Abigail Miyamoto M.D.   On: 04/07/2015 12:44   I have personally reviewed and evaluated these images and lab results as part of my medical decision-making.   EKG Interpretation   Date/Time:  Thursday April 07 2015 10:42:24 EDT Ventricular Rate:  75 PR Interval:  123 QRS Duration: 140 QT Interval:  423 QTC Calculation: 472 R Axis:   97 Text Interpretation:  Sinus rhythm RBBB and LPFB ED PHYSICIAN  INTERPRETATION AVAILABLE IN [facility] Confirmed by TEST, Record  ([id number]) on 04/08/2015 7:24:20 AM      MDM   Final diagnoses:  Chest pain, unspecified chest pain type   49 yo F here with atypical chest pain intermittently worsening since last night. Stress test in 2014 negative. Also with headache along the same time frame but that resolved before chest pain, doubt dissection. Doubt PE. Imaging and delta troponins negative so HEART score is 3 making outpatient workup reasonable. Discussed with on call cardiology, Dr. Loistine Chance, and he said if pain was gone to give rx for ntg and start on aspirin. Schedulers scheduled appointment before patient left ED for next week. Patient knows to come back immediately for any return of pain, sob or other concerning symptoms.   I have personally and contemperaneously reviewed labs and imaging and used in my decision making as above.   A medical screening exam was performed and I feel the patient has had an appropriate workup for their chief complaint at this time and likelihood of emergent condition existing is low. They have been counseled on decision, discharge, follow up and which symptoms necessitate immediate return to the emergency department. They or their family verbally stated understanding and agreement with plan  and discharged in stable condition.      Merrily Pew, MD 04/08/15 0800

## 2015-04-18 ENCOUNTER — Other Ambulatory Visit: Payer: Self-pay

## 2015-04-18 ENCOUNTER — Ambulatory Visit (INDEPENDENT_AMBULATORY_CARE_PROVIDER_SITE_OTHER): Payer: BC Managed Care – PPO | Admitting: Nurse Practitioner

## 2015-04-18 ENCOUNTER — Encounter: Payer: Self-pay | Admitting: Nurse Practitioner

## 2015-04-18 VITALS — BP 116/70 | HR 72 | Ht 61.0 in | Wt 146.0 lb

## 2015-04-18 DIAGNOSIS — R0789 Other chest pain: Secondary | ICD-10-CM | POA: Diagnosis not present

## 2015-04-18 MED ORDER — ASPIRIN 81 MG PO TBEC: 81.0000 mg | DELAYED_RELEASE_TABLET | Freq: Every day | ORAL | Status: DC

## 2015-04-18 NOTE — Patient Instructions (Addendum)
We will be checking the following labs today - NONE   Medication Instructions:    Continue with your current medicines.     Testing/Procedures To Be Arranged:  GXT  Follow-Up:   See [place] back for GXT    Other Special Instructions:   N/A    If you need a refill on your cardiac medications before your next appointment, please call your pharmacy.   Call the [place] office at [phone] if you have any questions, problems or concerns.

## 2015-04-18 NOTE — Progress Notes (Signed)
CARDIOLOGY OFFICE NOTE  Date:  04/18/2015    Sierra Haney Date of Birth: Sep 30, 1965 Medical Record #[id number]  PCP:  Lujean Amel, MD  Cardiologist:  Mare Ferrari    Chief Complaint  Patient presents with  . Chest Pain    Post ER visit - seen for Dr. Mare Ferrari    History of Present Illness: Sierra Haney is a 49 y.o. female who presents today for a post ER visit. Seen for Dr. Mare Ferrari (he saw in 03/2013).   She has no real past medical history. Denies a history of HTN, CAD, DM and reports no surgeries.  Seen two years ago with chest pain and had negative Myoview. FH + for HTN and CAD. She has a RBBB.   Presented with chest pain to the ER almost 2 weeks ago - negative evaluation - given RX for NTG and appointment made here.   Comes in today. Here with family. Says she has "the same thing". Her chest pain comes and goes - she has at work and at home. Feels like an "elephant sitting on her chest". Midsternal in location. She is not sure if she has it at rest. Nothing she can do will bring it on or make it go away - tries to "ignore". Now using some NTG - does not get immediate relief but says the NTG makes it not come back. She will have some shortness of breath. Was having some headaches - now resolved. She has gained weight. Now on [place] and says she has lost 9 pounds so far. Recent lipids noted. Sounds like more stress in regards to home situation. No exercise routine due to the "busyness" of the day.   Past Medical History  Diagnosis Date  . Right bundle branch block     No past surgical history on file.   Medications: Current Outpatient Prescriptions  Medication Sig Dispense Refill  . aspirin EC 325 MG tablet Take 1 tablet (325 mg total) by mouth daily. 30 tablet 0  . nitroGLYCERIN (NITROSTAT) 0.4 MG SL tablet Place 1 tablet (0.4 mg total) under the tongue every 5 (five) minutes as needed for chest pain. 30 tablet 12   No current facility-administered medications for  this visit.    Allergies: No Known Allergies  Social History: The patient  reports that she has never smoked. She does not have any smokeless tobacco history on file. She reports that she does not drink alcohol or use illicit drugs.   Family History: The patient's family history includes Asthma in her mother; Cancer in her father; Diabetes in her father; Heart attack in her mother; Hypertension in her father, mother, and sister.   Review of Systems: Please see the history of present illness.   Otherwise, the review of systems is positive for none.   All other systems are reviewed and negative.   Physical Exam: VS:  BP 116/70 mmHg  Pulse 72  Ht 5\' 1"  (1.549 m)  Wt 146 lb (66.225 kg)  BMI 27.60 kg/m2  LMP 08/16/2009 .  BMI Body mass index is 27.6 kg/(m^2).  Wt Readings from Last 3 Encounters:  04/18/15 146 lb (66.225 kg)  01/18/15 153 lb (69.4 kg)  01/15/14 140 lb 9.6 oz (63.776 kg)    General: Pleasant. Well developed, well nourished and in no acute distress. She has gained weight over the past 2 years.  HEENT: Normal. Neck: Supple, no JVD, carotid bruits, or masses noted.  Cardiac: Regular rate and rhythm. No murmurs, rubs,  or gallops. No edema.  Respiratory:  Lungs are clear to auscultation bilaterally with normal work of breathing.  GI: Soft and nontender.  MS: No deformity or atrophy. Gait and ROM intact. Skin: Warm and dry. Color is normal.  Neuro:  Strength and sensation are intact and no gross focal deficits noted.  Psych: Alert, appropriate and with normal affect.   LABORATORY DATA:  EKG:  EKG is not ordered today. EKG reviewed from earlier this month when in the ER.   Lab Results  Component Value Date   WBC 5.8 04/07/2015   HGB 14.5 04/07/2015   HCT 41.3 04/07/2015   PLT 143* 04/07/2015   GLUCOSE 105* 04/07/2015   CHOL 185 01/19/2015   TRIG 65 01/19/2015   HDL 57 01/19/2015   LDLCALC 115 01/19/2015   ALT 16 04/07/2015   AST 18 04/07/2015   NA 138  04/07/2015   K 3.6 04/07/2015   CL 106 04/07/2015   CREATININE 0.50 04/07/2015   BUN 12 04/07/2015   CO2 25 04/07/2015   TSH 1.004 01/19/2015   HGBA1C 5.4 04/16/2013    BNP (last 3 results) No results for input(s): BNP in the last 8760 hours.  ProBNP (last 3 results) No results for input(s): PROBNP in the last 8760 hours.   Other Studies Reviewed Today:  Myoview IMPRESSION from 2014: Clinicially and electrically negative for ischemia Excellent exercise tolerance Sestamibi with normal perfusion. LVEF greater than 70%   Electronically Signed  By: Jenkins Rouge M.D.  On: 04/17/2013 16:23  Assessment/Plan: 1. Chest pain - somewhat atypical - negative Myoview 2 years ago - would arrange GXT - further disposition to follow. IF GXT ok - would favor seeing PCP for further evaluation  2. +FH for HTN/CAD  3. RBBB - not new  4. Weight gain - she is actively dieting and successful with weight loss.   Current medicines are reviewed with the patient today.  The patient does not have concerns regarding medicines other than what has been noted above.  The following changes have been made:  See above.  Labs/ tests ordered today include:    Orders Placed This Encounter  Procedures  . Exercise Tolerance Test     Disposition:   Further disposition to follow.    Patient is agreeable to this plan and will call if any problems develop in the interim.   Signed: Burtis Junes, RN, ANP-C 04/18/2015 3:34 PM  [facility] [address] [place], [place]  [address] Phone: [phone] Fax: [phone]

## 2015-05-10 ENCOUNTER — Ambulatory Visit (INDEPENDENT_AMBULATORY_CARE_PROVIDER_SITE_OTHER): Payer: BC Managed Care – PPO

## 2015-05-10 ENCOUNTER — Encounter: Payer: BC Managed Care – PPO | Admitting: Physician Assistant

## 2015-05-10 DIAGNOSIS — R0789 Other chest pain: Secondary | ICD-10-CM | POA: Diagnosis not present

## 2015-05-10 LAB — EXERCISE TOLERANCE TEST
CHL CUP RESTING HR STRESS: 64 {beats}/min
CSEPED: 11 min
CSEPEDS: 27 s
CSEPPHR: 166 {beats}/min
Estimated workload: 13.4 METS
MPHR: 169 {beats}/min
Percent HR: 98 %
RPE: 19

## 2015-05-17 ENCOUNTER — Telehealth: Payer: Self-pay | Admitting: Cardiovascular Disease

## 2015-05-17 NOTE — Telephone Encounter (Signed)
Informed patient of results and verbal understanding expressed.  Instructed patient to FU with PCP for other noncardiac causes of chest pain. Patient agrees with treatment plan and is grateful for callback.

## 2015-05-17 NOTE — Telephone Encounter (Signed)
New problem   Pt returning call concerning test results.

## 2015-05-17 NOTE — Telephone Encounter (Signed)
-----   Message from Darlin Coco, MD sent at 05/10/2015  3:43 PM EST ----- Please report.  I reviewed the strips from her ETT. There was no evidence of ischemia. Normal ETT with pre-existing RBBB. As per [place] office note, suggest she follow up with her PCP for other noncardiac causes of chest pain.

## 2016-01-11 ENCOUNTER — Other Ambulatory Visit (HOSPITAL_COMMUNITY): Payer: Self-pay | Admitting: Obstetrics & Gynecology

## 2016-01-11 DIAGNOSIS — Z1231 Encounter for screening mammogram for malignant neoplasm of breast: Secondary | ICD-10-CM

## 2016-01-19 ENCOUNTER — Encounter: Payer: Self-pay | Admitting: *Deleted

## 2016-01-19 ENCOUNTER — Ambulatory Visit (INDEPENDENT_AMBULATORY_CARE_PROVIDER_SITE_OTHER): Payer: BC Managed Care – PPO | Admitting: Obstetrics & Gynecology

## 2016-01-19 ENCOUNTER — Encounter: Payer: Self-pay | Admitting: Obstetrics & Gynecology

## 2016-01-19 VITALS — BP 107/71 | HR 60 | Ht 61.0 in | Wt 123.4 lb

## 2016-01-19 DIAGNOSIS — Z124 Encounter for screening for malignant neoplasm of cervix: Secondary | ICD-10-CM | POA: Diagnosis not present

## 2016-01-19 DIAGNOSIS — Z01419 Encounter for gynecological examination (general) (routine) without abnormal findings: Secondary | ICD-10-CM

## 2016-01-19 DIAGNOSIS — Z1151 Encounter for screening for human papillomavirus (HPV): Secondary | ICD-10-CM | POA: Diagnosis not present

## 2016-01-19 LAB — CBC
HEMATOCRIT: 45.1 % — AB (ref 35.0–45.0)
HEMOGLOBIN: 15.6 g/dL — AB (ref 11.7–15.5)
MCH: 29.8 pg (ref 27.0–33.0)
MCHC: 34.6 g/dL (ref 32.0–36.0)
MCV: 86.2 fL (ref 80.0–100.0)
MPV: 11.3 fL (ref 7.5–12.5)
Platelets: 183 10*3/uL (ref 140–400)
RBC: 5.23 MIL/uL — AB (ref 3.80–5.10)
RDW: 13.6 % (ref 11.0–15.0)
WBC: 5.1 10*3/uL (ref 3.8–10.8)

## 2016-01-19 LAB — TSH: TSH: 1.61 m[IU]/L

## 2016-01-19 NOTE — Progress Notes (Signed)
Subjective:    Sierra Haney is a 50 y.o.MW P1 (raising her 58 yo grandson)  female who presents for an annual exam. The patient has no complaints today. The patient is sexually active. GYN screening history: last pap: was normal. The patient wears seatbelts: yes. The patient participates in regular exercise: yes. Has the patient ever been transfused or tattooed?: no. The patient reports that there is not domestic violence in her life.   Menstrual History: OB History    No data available      Menarche age: 60 Patient's last menstrual period was 08/16/2009.    The following portions of the patient's history were reviewed and updated as appropriate: allergies, current medications, past family history, past medical history, past social history, past surgical history and problem list.  Review of Systems Pertinent items are noted in HPI.  Married for 32 years. Decreased libido, test ointment didn't help. 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   Objective:    BP 107/71   Pulse 60   Ht 5\' 1"  (1.549 m)   Wt 123 lb 6.4 oz (56 kg)   LMP 08/16/2009   BMI 23.32 kg/m   General Appearance:    Alert, cooperative, no distress, appears stated age  Head:    Normocephalic, without obvious abnormality, atraumatic  Eyes:    PERRL, conjunctiva/corneas clear, EOM's intact, fundi    benign, both eyes  Ears:    Normal TM's and external ear canals, both ears  Nose:   Nares normal, septum midline, mucosa normal, no drainage    or sinus tenderness  Throat:   Lips, mucosa, and tongue normal; teeth and gums normal  Neck:   Supple, symmetrical, trachea midline, no adenopathy;    thyroid:  no enlargement/tenderness/nodules; no carotid   bruit or JVD  Back:     Symmetric, no curvature, ROM normal, no CVA tenderness  Lungs:     Clear to auscultation bilaterally, respirations unlabored  Chest Wall:    No tenderness or deformity   Heart:    Regular rate and rhythm, S1 and S2 normal, no murmur, rub   or gallop  Breast Exam:    No  tenderness, masses, or nipple abnormality  Abdomen:     Soft, non-tender, bowel sounds active all four quadrants,    no masses, no organomegaly  Genitalia:    Normal female without lesion, discharge or tenderness, NSSA, NT, mobile, no palpable adnexal masses     Extremities:   Extremities normal, atraumatic, no cyanosis or edema  Pulses:   2+ and symmetric all extremities  Skin:   Skin color, texture, turgor normal, no rashes or lesions  Lymph nodes:   Cervical, supraclavicular, and axillary nodes normal  Neurologic:   CNII-XII intact, normal strength, sensation and reflexes    throughout   .    Assessment:    Healthy female exam.    Plan:     Mammogram. Thin prep Pap smear. with cotesting

## 2016-01-20 LAB — COMPREHENSIVE METABOLIC PANEL
ALBUMIN: 4.4 g/dL (ref 3.6–5.1)
ALK PHOS: 62 U/L (ref 33–130)
ALT: 12 U/L (ref 6–29)
AST: 17 U/L (ref 10–35)
BUN: 8 mg/dL (ref 7–25)
CALCIUM: 9.4 mg/dL (ref 8.6–10.4)
CHLORIDE: 102 mmol/L (ref 98–110)
CO2: 26 mmol/L (ref 20–31)
Creat: 0.72 mg/dL (ref 0.50–1.05)
Glucose, Bld: 88 mg/dL (ref 65–99)
Potassium: 3.9 mmol/L (ref 3.5–5.3)
Sodium: 139 mmol/L (ref 135–146)
TOTAL PROTEIN: 7.1 g/dL (ref 6.1–8.1)
Total Bilirubin: 0.6 mg/dL (ref 0.2–1.2)

## 2016-01-20 LAB — LIPID PANEL
CHOL/HDL RATIO: 3 ratio (ref <=5.0)
CHOLESTEROL: 187 mg/dL (ref 125–200)
HDL: 62 mg/dL (ref >=46)
LDL Cholesterol: 110 mg/dL (ref <130)
Triglycerides: 77 mg/dL (ref <150)
VLDL: 15 mg/dL (ref <30)

## 2016-01-20 LAB — VITAMIN D 25 HYDROXY (VIT D DEFICIENCY, FRACTURES): Vit D, 25-Hydroxy: 24 ng/mL — ABNORMAL LOW (ref 30–100)

## 2016-01-23 LAB — CYTOLOGY - PAP

## 2016-01-26 ENCOUNTER — Encounter: Payer: Self-pay | Admitting: *Deleted

## 2016-01-26 ENCOUNTER — Telehealth: Payer: Self-pay | Admitting: *Deleted

## 2016-01-26 MED ORDER — VITAMIN D (ERGOCALCIFEROL) 1.25 MG (50000 UNIT) PO CAPS: 50000.0000 [IU] | ORAL_CAPSULE | ORAL | 0 refills | Status: DC

## 2016-01-26 NOTE — Telephone Encounter (Signed)
-----   Message from Emily Filbert, MD sent at 01/25/2016  3:45 PM EDT ----- She will need 8 weeks of weekly Vitamin D 50,000 units and then a recheck of her level. Thanks

## 2016-01-26 NOTE — Telephone Encounter (Signed)
Called pt to adv meds at pharm. LM for pt to rtn call if needed.

## 2016-12-20 ENCOUNTER — Other Ambulatory Visit (HOSPITAL_COMMUNITY): Payer: Self-pay | Admitting: Obstetrics & Gynecology

## 2016-12-20 DIAGNOSIS — Z1231 Encounter for screening mammogram for malignant neoplasm of breast: Secondary | ICD-10-CM

## 2017-01-08 ENCOUNTER — Ambulatory Visit
Admission: RE | Admit: 2017-01-08 | Discharge: 2017-01-08 | Disposition: A | Discharge status: Home / Self Care | Payer: BC Managed Care – PPO | Source: Ambulatory Visit | Attending: Obstetrics & Gynecology | Admitting: Obstetrics & Gynecology

## 2017-01-08 DIAGNOSIS — Z1231 Encounter for screening mammogram for malignant neoplasm of breast: Secondary | ICD-10-CM

## 2017-01-22 NOTE — Progress Notes (Signed)
GYNECOLOGY ANNUAL PREVENTATIVE CARE ENCOUNTER NOTE  Subjective:   Sierra Haney is a 51 y.o. female here for a routine annual gynecologic exam.  Current complaints: none.   Denies abnormal vaginal bleeding, discharge, pelvic pain, problems with intercourse or other gynecologic concerns.    Gynecologic History Patient's last menstrual period was 08/16/2009. Contraception: post menopausal status Last Pap: 8/17. Results were: normal with negative HPV Last mammogram: 7/18. Results were: normal  Obstetric History OB History  No data available    Past Medical History:  Diagnosis Date  . Right bundle branch block     History reviewed. No pertinent surgical history.  Current Outpatient Prescriptions on File Prior to Visit  Medication Sig Dispense Refill  . nitroGLYCERIN (NITROSTAT) 0.4 MG SL tablet Place 1 tablet (0.4 mg total) under the tongue every 5 (five) minutes as needed for chest pain. (Patient not taking: Reported on 01/24/2017) 30 tablet 12   No current facility-administered medications on file prior to visit.     No Known Allergies  Social History   Social History  . Marital status: Married    Spouse name: Max  . Number of children: 1  . Years of education: N/A   Occupational History  . kindergarden teacher [employer]   Social History Main Topics  . Smoking status: Never Smoker  . Smokeless tobacco: Never Used  . Alcohol use No  . Drug use: No  . Sexual activity: Yes    Partners: Male    Birth control/ protection: Post-menopausal   Other Topics Concern  . Not on file   Social History Narrative  . No narrative on file    Family History  Problem Relation Age of Onset  . Hypertension Mother   . Asthma Mother   . Heart attack Mother        age 27  . Hypertension Father   . Diabetes Father   . Cancer Father   . Hypertension Sister   . Breast cancer Neg Hx     The following portions of the patient's history were reviewed and updated as  appropriate: allergies, current medications, past family history, past medical history, past social history, past surgical history and problem list.  Review of Systems Pertinent items noted in HPI and remainder of comprehensive ROS otherwise negative.   Objective:  BP 113/76   Pulse 66   Wt 134 lb (60.8 kg)   LMP 08/16/2009   BMI 25.32 kg/m  CONSTITUTIONAL: Well-developed, well-nourished female in no acute distress.  HENT:  Normocephalic, atraumatic, External right and left ear normal. Oropharynx is clear and moist EYES: Conjunctivae and EOM are normal. Pupils are equal, round, and reactive to light. No scleral icterus.  NECK: Normal range of motion, supple, no masses.  Normal thyroid.  SKIN: Skin is warm and dry. No rash noted. Not diaphoretic. No erythema. No pallor. NEUROLOGIC: Alert and oriented to person, place, and time. Normal reflexes, muscle tone coordination. No cranial nerve deficit noted. PSYCHIATRIC: Normal mood and affect. Normal behavior. Normal judgment and thought content. CARDIOVASCULAR: Normal heart rate noted, regular rhythm RESPIRATORY: Clear to auscultation bilaterally. Effort and breath sounds normal, no problems with respiration noted. BREASTS: Symmetric in size. No masses, skin changes, nipple drainage, or lymphadenopathy. ABDOMEN: Soft, normal bowel sounds, no distention noted.  No tenderness, rebound or guarding.  PELVIC: Normal appearing external genitalia; normal appearing vaginal mucosa and cervix.  Mild atrophy.  No abnormal discharge noted.  Pap smear obtained.  Normal uterine size, no  other palpable masses, no uterine or adnexal tenderness. MUSCULOSKELETAL: Normal range of motion. No tenderness.  No cyanosis, clubbing, or edema.  2+ distal pulses.   Assessment and Plan:  1. Well woman exam with routine gynecological exam Desires health maintenance labs and STI screen. - CBC - Comprehensive metabolic panel - Hemoglobin A1c - Lipid panel - TSH -  Vitamin D 1,25 dihydroxy - HIV antibody - Hepatitis B surface antigen - Hepatitis C antibody - RPR - Cytology - PAP Will follow up results of all labs and pap smear and manage accordingly. Will follow up with PCP abou colon cancer screening. Routine preventative health maintenance measures emphasized. Please refer to After Visit Summary for other counseling recommendations.    Verita Schneiders, MD, [place] Attending Obstetrician & Gynecologist, [place] for [facility]

## 2017-01-24 ENCOUNTER — Ambulatory Visit (INDEPENDENT_AMBULATORY_CARE_PROVIDER_SITE_OTHER): Payer: BC Managed Care – PPO | Admitting: Obstetrics & Gynecology

## 2017-01-24 ENCOUNTER — Encounter: Payer: Self-pay | Admitting: Obstetrics & Gynecology

## 2017-01-24 VITALS — BP 113/76 | HR 66 | Wt 134.0 lb

## 2017-01-24 DIAGNOSIS — Z124 Encounter for screening for malignant neoplasm of cervix: Secondary | ICD-10-CM | POA: Diagnosis not present

## 2017-01-24 DIAGNOSIS — Z1151 Encounter for screening for human papillomavirus (HPV): Secondary | ICD-10-CM | POA: Diagnosis not present

## 2017-01-24 DIAGNOSIS — Z01419 Encounter for gynecological examination (general) (routine) without abnormal findings: Secondary | ICD-10-CM | POA: Diagnosis not present

## 2017-01-24 NOTE — Patient Instructions (Signed)
Preventive Care 40-64 Years, Female Preventive care refers to lifestyle choices and visits with your health care provider that can promote health and wellness. What does preventive care include?  A yearly physical exam. This is also called an annual well check.  Dental exams once or twice a year.  Routine eye exams. Ask your health care provider how often you should have your eyes checked.  Personal lifestyle choices, including: ? Daily care of your teeth and gums. ? Regular physical activity. ? Eating a healthy diet. ? Avoiding tobacco and drug use. ? Limiting alcohol use. ? Practicing safe sex. ? Taking low-dose aspirin daily starting at age 58. ? Taking vitamin and mineral supplements as recommended by your health care provider. What happens during an annual well check? The services and screenings done by your health care provider during your annual well check will depend on your age, overall health, lifestyle risk factors, and family history of disease. Counseling Your health care provider may ask you questions about your:  Alcohol use.  Tobacco use.  Drug use.  Emotional well-being.  Home and relationship well-being.  Sexual activity.  Eating habits.  Work and work Statistician.  Method of birth control.  Menstrual cycle.  Pregnancy history.  Screening You may have the following tests or measurements:  Height, weight, and BMI.  Blood pressure.  Lipid and cholesterol levels. These may be checked every 5 years, or more frequently if you are over 81 years old.  Skin check.  Lung cancer screening. You may have this screening every year starting at age 78 if you have a 30-pack-year history of smoking and currently smoke or have quit within the past 15 years.  Fecal occult blood test (FOBT) of the stool. You may have this test every year starting at age 65.  Flexible sigmoidoscopy or colonoscopy. You may have a sigmoidoscopy every 5 years or a colonoscopy  every 10 years starting at age 30.  Hepatitis C blood test.  Hepatitis B blood test.  Sexually transmitted disease (STD) testing.  Diabetes screening. This is done by checking your blood sugar (glucose) after you have not eaten for a while (fasting). You may have this done every 1-3 years.  Mammogram. This may be done every 1-2 years. Talk to your health care provider about when you should start having regular mammograms. This may depend on whether you have a family history of breast cancer.  BRCA-related cancer screening. This may be done if you have a family history of breast, ovarian, tubal, or peritoneal cancers.  Pelvic exam and Pap test. This may be done every 3 years starting at age 80. Starting at age 36, this may be done every 5 years if you have a Pap test in combination with an HPV test.  Bone density scan. This is done to screen for osteoporosis. You may have this scan if you are at high risk for osteoporosis.  Discuss your test results, treatment options, and if necessary, the need for more tests with your health care provider. Vaccines Your health care provider may recommend certain vaccines, such as:  Influenza vaccine. This is recommended every year.  Tetanus, diphtheria, and acellular pertussis (Tdap, Td) vaccine. You may need a Td booster every 10 years.  Varicella vaccine. You may need this if you have not been vaccinated.  Zoster vaccine. You may need this after age 5.  Measles, mumps, and rubella (MMR) vaccine. You may need at least one dose of MMR if you were born in  1957 or later. You may also need a second dose.  Pneumococcal 13-valent conjugate (PCV13) vaccine. You may need this if you have certain conditions and were not previously vaccinated.  Pneumococcal polysaccharide (PPSV23) vaccine. You may need one or two doses if you smoke cigarettes or if you have certain conditions.  Meningococcal vaccine. You may need this if you have certain  conditions.  Hepatitis A vaccine. You may need this if you have certain conditions or if you travel or work in places where you may be exposed to hepatitis A.  Hepatitis B vaccine. You may need this if you have certain conditions or if you travel or work in places where you may be exposed to hepatitis B.  Haemophilus influenzae type b (Hib) vaccine. You may need this if you have certain conditions.  Talk to your health care provider about which screenings and vaccines you need and how often you need them. This information is not intended to replace advice given to you by your health care provider. Make sure you discuss any questions you have with your health care provider. Document Released: 07/01/2015 Document Revised: 02/22/2016 Document Reviewed: 04/05/2015 Elsevier Interactive Patient Education  2017 [employer].

## 2017-01-28 LAB — CYTOLOGY - PAP
Diagnosis: NEGATIVE
HPV (WINDOPATH): NOT DETECTED

## 2017-01-29 LAB — RPR: RPR: NONREACTIVE

## 2017-01-29 LAB — CBC
HEMATOCRIT: 47.1 % — AB (ref 34.0–46.6)
HEMOGLOBIN: 16.4 g/dL — AB (ref 11.1–15.9)
MCH: 30.3 pg (ref 26.6–33.0)
MCHC: 34.8 g/dL (ref 31.5–35.7)
MCV: 87 fL (ref 79–97)
Platelets: 165 10*3/uL (ref 150–379)
RBC: 5.41 x10E6/uL — ABNORMAL HIGH (ref 3.77–5.28)
RDW: 13.7 % (ref 12.3–15.4)
WBC: 4.3 10*3/uL (ref 3.4–10.8)

## 2017-01-29 LAB — COMPREHENSIVE METABOLIC PANEL
ALBUMIN: 5 g/dL (ref 3.5–5.5)
ALT: 15 IU/L (ref 0–32)
AST: 21 IU/L (ref 0–40)
Albumin/Globulin Ratio: 1.9 (ref 1.2–2.2)
Alkaline Phosphatase: 72 IU/L (ref 39–117)
BILIRUBIN TOTAL: 0.5 mg/dL (ref 0.0–1.2)
BUN / CREAT RATIO: 12 (ref 9–23)
BUN: 8 mg/dL (ref 6–24)
CHLORIDE: 101 mmol/L (ref 96–106)
CO2: 24 mmol/L (ref 20–29)
Calcium: 9.6 mg/dL (ref 8.7–10.2)
Creatinine, Ser: 0.66 mg/dL (ref 0.57–1.00)
GFR calc non Af Amer: 101 mL/min/{1.73_m2} (ref >59)
GFR, EST AFRICAN AMERICAN: 117 mL/min/{1.73_m2} (ref >59)
GLOBULIN, TOTAL: 2.6 g/dL (ref 1.5–4.5)
GLUCOSE: 77 mg/dL (ref 65–99)
Potassium: 4.1 mmol/L (ref 3.5–5.2)
Sodium: 140 mmol/L (ref 134–144)
TOTAL PROTEIN: 7.6 g/dL (ref 6.0–8.5)

## 2017-01-29 LAB — VITAMIN D 1,25 DIHYDROXY
VITAMIN D3 1, 25 (OH): 30 pg/mL
Vitamin D 1, 25 (OH)2 Total: 32 pg/mL
Vitamin D2 1, 25 (OH)2: <10 pg/mL

## 2017-01-29 LAB — LIPID PANEL
Chol/HDL Ratio: 3.1 ratio (ref 0.0–4.4)
Cholesterol, Total: 211 mg/dL — ABNORMAL HIGH (ref 100–199)
HDL: 69 mg/dL (ref >39)
LDL Calculated: 127 mg/dL — ABNORMAL HIGH (ref 0–99)
TRIGLYCERIDES: 73 mg/dL (ref 0–149)
VLDL CHOLESTEROL CAL: 15 mg/dL (ref 5–40)

## 2017-01-29 LAB — HEMOGLOBIN A1C
Est. average glucose Bld gHb Est-mCnc: 100 mg/dL
Hgb A1c MFr Bld: 5.1 % (ref 4.8–5.6)

## 2017-01-29 LAB — HEPATITIS B SURFACE ANTIGEN: HEP B S AG: NEGATIVE

## 2017-01-29 LAB — HEPATITIS C ANTIBODY

## 2017-01-29 LAB — TSH: TSH: 1.62 u[IU]/mL (ref 0.450–4.500)

## 2017-01-29 LAB — HIV ANTIBODY (ROUTINE TESTING W REFLEX): HIV SCREEN 4TH GENERATION: NONREACTIVE

## 2017-08-05 IMAGING — MG DIGITAL SCREENING BILATERAL MAMMOGRAM WITH CAD
4 series · 4 of 4 positions shown · non-contrast
Comparison: Previous exam(s).

CLINICAL DATA: Screening.

EXAM:
DIGITAL SCREENING BILATERAL MAMMOGRAM WITH CAD

[R MLO]
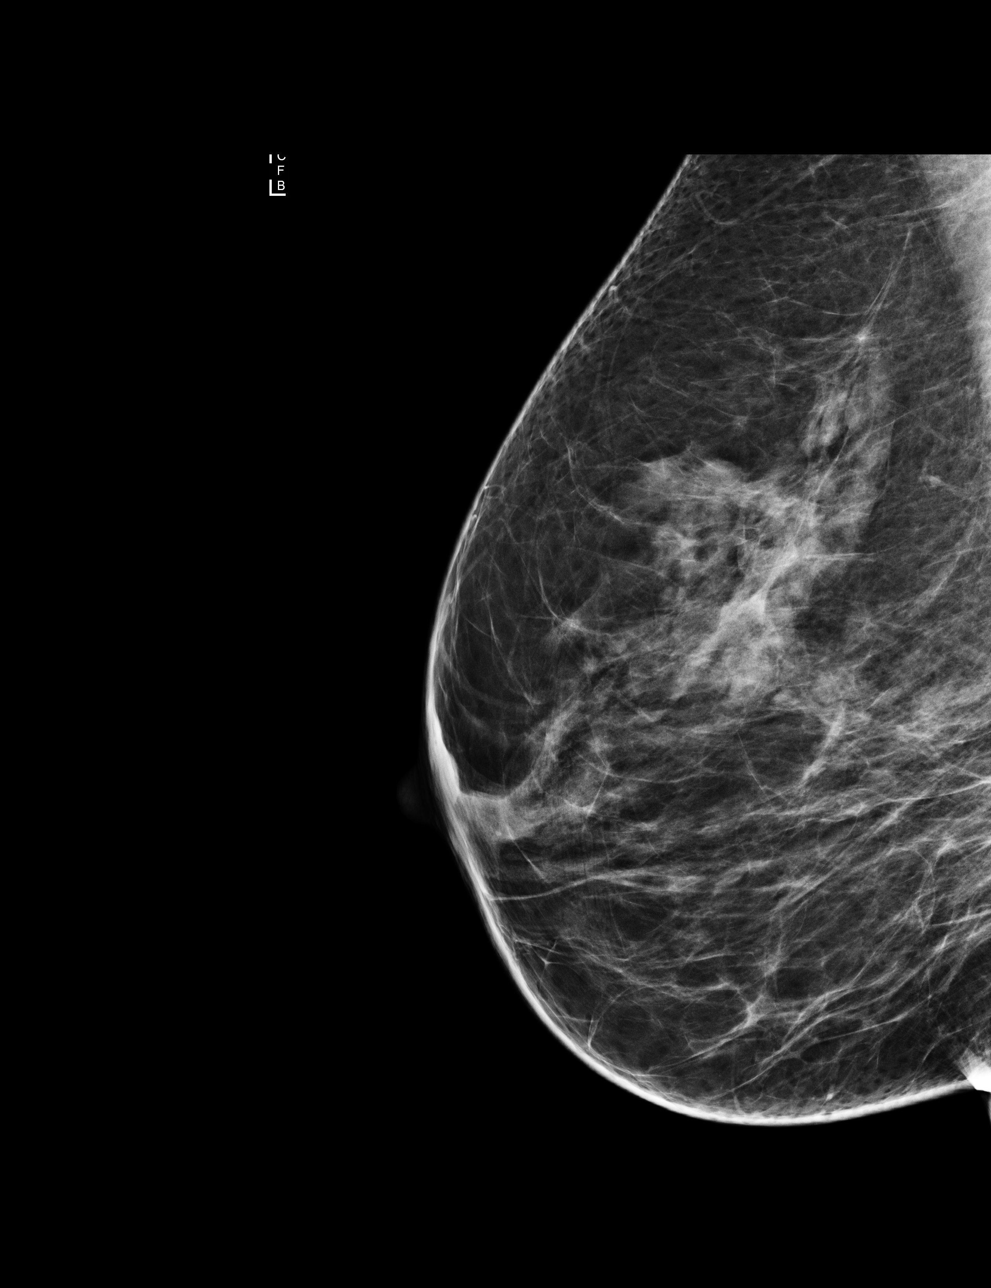

[R CC]
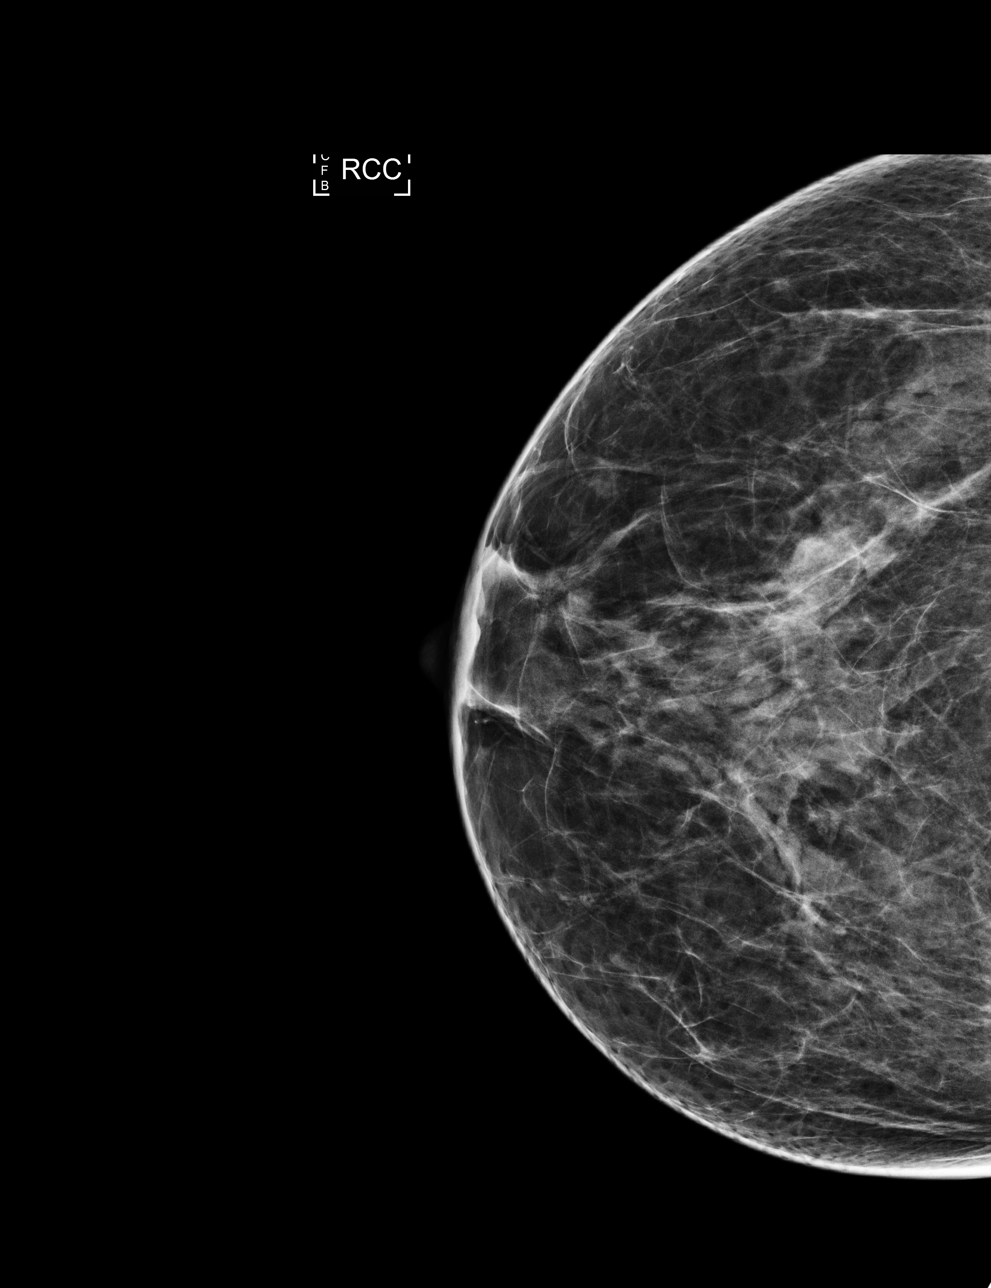

[L CC]
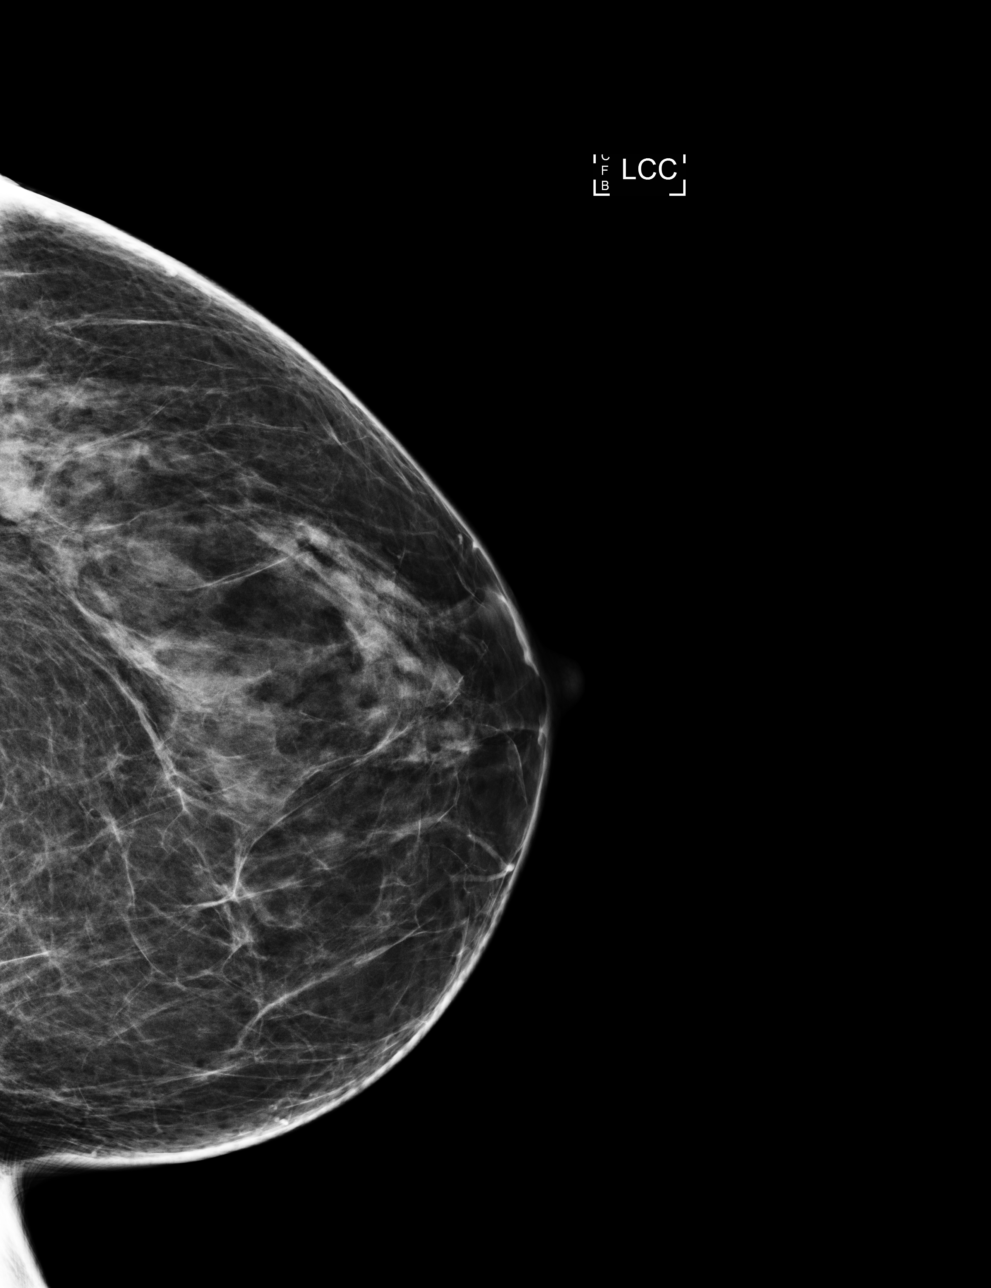

[L MLO]
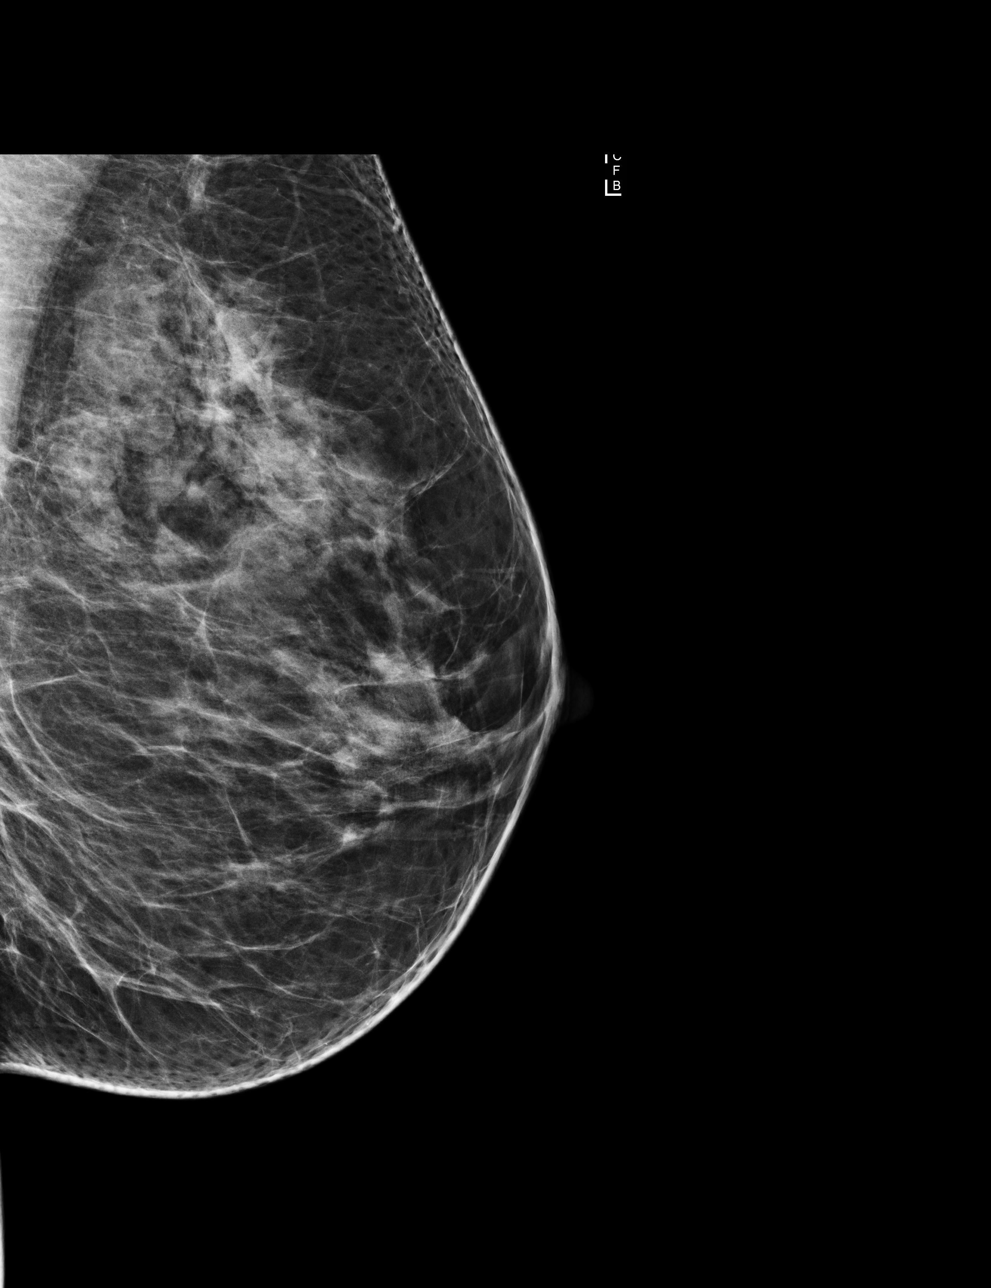

[4 of 4 positions shown; findings below may reference images not displayed]

ACR Breast Density Category b: There are scattered areas of
fibroglandular density.
FINDINGS: There are no findings suspicious for malignancy. Images were
processed with CAD.
IMPRESSION: No mammographic evidence of malignancy. A result letter of this
screening mammogram will be mailed directly to the patient.

RECOMMENDATION:
Screening mammogram in one year. (Code:AS-G-LCT)

BI-RADS CATEGORY  1: Negative.

## 2017-11-25 ENCOUNTER — Other Ambulatory Visit (HOSPITAL_COMMUNITY): Payer: Self-pay | Admitting: Obstetrics & Gynecology

## 2017-11-25 DIAGNOSIS — Z1231 Encounter for screening mammogram for malignant neoplasm of breast: Secondary | ICD-10-CM

## 2017-11-28 ENCOUNTER — Encounter: Payer: Self-pay | Admitting: Radiology

## 2018-01-10 ENCOUNTER — Ambulatory Visit
Admission: RE | Admit: 2018-01-10 | Discharge: 2018-01-10 | Disposition: A | Discharge status: Home / Self Care | Payer: BC Managed Care – PPO | Source: Ambulatory Visit | Attending: Obstetrics & Gynecology | Admitting: Obstetrics & Gynecology

## 2018-01-10 DIAGNOSIS — Z1231 Encounter for screening mammogram for malignant neoplasm of breast: Secondary | ICD-10-CM

## 2018-01-14 ENCOUNTER — Other Ambulatory Visit (HOSPITAL_COMMUNITY): Payer: Self-pay | Admitting: Obstetrics & Gynecology

## 2018-01-14 DIAGNOSIS — R928 Other abnormal and inconclusive findings on diagnostic imaging of breast: Secondary | ICD-10-CM

## 2018-01-17 ENCOUNTER — Ambulatory Visit: Payer: BC Managed Care – PPO

## 2018-01-17 ENCOUNTER — Ambulatory Visit
Admission: RE | Admit: 2018-01-17 | Discharge: 2018-01-17 | Disposition: A | Discharge status: Home / Self Care | Payer: BC Managed Care – PPO | Source: Ambulatory Visit | Attending: Obstetrics & Gynecology | Admitting: Obstetrics & Gynecology

## 2018-01-17 ENCOUNTER — Other Ambulatory Visit (HOSPITAL_COMMUNITY): Payer: Self-pay | Admitting: Obstetrics & Gynecology

## 2018-01-17 DIAGNOSIS — R928 Other abnormal and inconclusive findings on diagnostic imaging of breast: Secondary | ICD-10-CM

## 2018-01-30 NOTE — Progress Notes (Signed)
Last pap 01/24/2017 normal  MM 01/17/2018

## 2018-01-31 ENCOUNTER — Ambulatory Visit (INDEPENDENT_AMBULATORY_CARE_PROVIDER_SITE_OTHER): Payer: BC Managed Care – PPO | Admitting: Obstetrics & Gynecology

## 2018-01-31 ENCOUNTER — Encounter: Payer: Self-pay | Admitting: Obstetrics & Gynecology

## 2018-01-31 VITALS — BP 111/71 | HR 61 | Wt 143.0 lb

## 2018-01-31 DIAGNOSIS — Z23 Encounter for immunization: Secondary | ICD-10-CM | POA: Diagnosis not present

## 2018-01-31 DIAGNOSIS — Z01419 Encounter for gynecological examination (general) (routine) without abnormal findings: Secondary | ICD-10-CM

## 2018-01-31 NOTE — Progress Notes (Signed)
immSubjective:    Sierra Haney is a 52 y.o. married for 34 years P1 but also raising her grandson 54 yo-female who presents for an annual exam. The patient has no complaints today. The patient is not currently sexually active, no desire on anyone's part.  GYN screening history: last pap: was normal. The patient wears seatbelts: yes. The patient participates in regular exercise: yes. (total gym)  Has the patient ever been transfused or tattooed?: no. The patient reports that there is not domestic violence in her life.   Menstrual History: OB History   None     Menarche age: 59 Patient's last menstrual period was 08/16/2009.    The following portions of the patient's history were reviewed and updated as appropriate: allergies, current medications, past family history, past medical history, past social history, past surgical history and problem list.  Review of Systems Pertinent items are noted in HPI.   FH-+ breast cancer in paternal aunt, no gyn or colon cancer Goes to [facility] TA   Objective:    BP 111/71   Pulse 61   Wt 143 lb (64.9 kg)   LMP 08/16/2009   BMI 27.02 kg/m   General Appearance:    Alert, cooperative, no distress, appears stated age  Head:    Normocephalic, without obvious abnormality, atraumatic  Eyes:    PERRL, conjunctiva/corneas clear, EOM's intact, fundi    benign, both eyes  Ears:    Normal TM's and external ear canals, both ears  Nose:   Nares normal, septum midline, mucosa normal, no drainage    or sinus tenderness  Throat:   Lips, mucosa, and tongue normal; teeth and gums normal  Neck:   Supple, symmetrical, trachea midline, no adenopathy;    thyroid:  no enlargement/tenderness/nodules; no carotid   bruit or JVD  Back:     Symmetric, no curvature, ROM normal, no CVA tenderness  Lungs:     Clear to auscultation bilaterally, respirations unlabored  Chest Wall:    No tenderness or deformity   Heart:    Regular rate and rhythm, S1 and S2  normal, no murmur, rub   or gallop  Breast Exam:    No tenderness, masses, or nipple abnormality  Abdomen:     Soft, non-tender, bowel sounds active all four quadrants,    no masses, no organomegaly  Genitalia:    Normal female without lesion, discharge or tenderness, moderate vulvovaginal atrophy, normal size and shape, mid-plane, non- mobile, non-tender, normal adnexal exam      Extremities:   Extremities normal, atraumatic, no cyanosis or edema  Pulses:   2+ and symmetric all extremities  Skin:   Skin color, texture, turgor normal, no rashes or lesions  Lymph nodes:   Cervical, supraclavicular, and axillary nodes normal  Neurologic:   CNII-XII intact, normal strength, sensation and reflexes    throughout  .    Assessment:    Healthy female exam.    Plan:     flu vaccine today   Rec colon screening Fasting labs today

## 2018-01-31 NOTE — Patient Instructions (Signed)

## 2018-02-01 LAB — CBC
HEMATOCRIT: 44 % (ref 34.0–46.6)
Hemoglobin: 14.4 g/dL (ref 11.1–15.9)
MCH: 28.8 pg (ref 26.6–33.0)
MCHC: 32.7 g/dL (ref 31.5–35.7)
MCV: 88 fL (ref 79–97)
Platelets: 156 10*3/uL (ref 150–450)
RBC: 5 x10E6/uL (ref 3.77–5.28)
RDW: 13.6 % (ref 12.3–15.4)
WBC: 4.4 10*3/uL (ref 3.4–10.8)

## 2018-02-01 LAB — COMPREHENSIVE METABOLIC PANEL
ALK PHOS: 72 IU/L (ref 39–117)
ALT: 17 IU/L (ref 0–32)
AST: 22 IU/L (ref 0–40)
Albumin/Globulin Ratio: 1.7 (ref 1.2–2.2)
Albumin: 4.5 g/dL (ref 3.5–5.5)
BILIRUBIN TOTAL: 0.6 mg/dL (ref 0.0–1.2)
BUN/Creatinine Ratio: 18 (ref 9–23)
BUN: 10 mg/dL (ref 6–24)
CHLORIDE: 100 mmol/L (ref 96–106)
CO2: 25 mmol/L (ref 20–29)
CREATININE: 0.56 mg/dL — AB (ref 0.57–1.00)
Calcium: 9.4 mg/dL (ref 8.7–10.2)
GFR calc Af Amer: 122 mL/min/{1.73_m2} (ref >59)
GFR calc non Af Amer: 106 mL/min/{1.73_m2} (ref >59)
GLOBULIN, TOTAL: 2.6 g/dL (ref 1.5–4.5)
Glucose: 96 mg/dL (ref 65–99)
POTASSIUM: 4.3 mmol/L (ref 3.5–5.2)
SODIUM: 136 mmol/L (ref 134–144)
Total Protein: 7.1 g/dL (ref 6.0–8.5)

## 2018-02-01 LAB — LIPID PANEL
CHOLESTEROL TOTAL: 208 mg/dL — AB (ref 100–199)
Chol/HDL Ratio: 3.3 ratio (ref 0.0–4.4)
HDL: 64 mg/dL (ref >39)
LDL CALC: 132 mg/dL — AB (ref 0–99)
Triglycerides: 61 mg/dL (ref 0–149)
VLDL Cholesterol Cal: 12 mg/dL (ref 5–40)

## 2018-02-01 LAB — TSH: TSH: 1.33 u[IU]/mL (ref 0.450–4.500)

## 2019-01-20 ENCOUNTER — Other Ambulatory Visit: Payer: Self-pay | Admitting: Obstetrics & Gynecology

## 2019-01-20 DIAGNOSIS — Z1231 Encounter for screening mammogram for malignant neoplasm of breast: Secondary | ICD-10-CM

## 2019-01-21 ENCOUNTER — Other Ambulatory Visit: Payer: Self-pay

## 2019-01-21 ENCOUNTER — Ambulatory Visit
Admission: RE | Admit: 2019-01-21 | Discharge: 2019-01-21 | Disposition: A | Discharge status: Home / Self Care | Payer: BC Managed Care – PPO | Source: Ambulatory Visit

## 2019-01-21 DIAGNOSIS — Z1231 Encounter for screening mammogram for malignant neoplasm of breast: Secondary | ICD-10-CM

## 2019-02-03 ENCOUNTER — Ambulatory Visit: Payer: BC Managed Care – PPO | Admitting: Obstetrics & Gynecology

## 2019-02-17 ENCOUNTER — Ambulatory Visit: Payer: BC Managed Care – PPO | Admitting: Obstetrics & Gynecology

## 2019-05-22 ENCOUNTER — Other Ambulatory Visit: Payer: Self-pay

## 2019-05-22 DIAGNOSIS — Z20822 Contact with and (suspected) exposure to covid-19: Secondary | ICD-10-CM

## 2019-05-25 LAB — NOVEL CORONAVIRUS, NAA: SARS-CoV-2, NAA: NOT DETECTED

## 2019-07-10 ENCOUNTER — Other Ambulatory Visit: Payer: BC Managed Care – PPO

## 2020-12-02 ENCOUNTER — Other Ambulatory Visit: Payer: Self-pay

## 2020-12-02 ENCOUNTER — Encounter: Payer: Self-pay | Admitting: Nurse Practitioner

## 2020-12-02 ENCOUNTER — Ambulatory Visit (INDEPENDENT_AMBULATORY_CARE_PROVIDER_SITE_OTHER): Payer: BC Managed Care – PPO | Admitting: Nurse Practitioner

## 2020-12-02 ENCOUNTER — Other Ambulatory Visit (HOSPITAL_COMMUNITY)
Admission: RE | Admit: 2020-12-02 | Discharge: 2020-12-02 | Disposition: A | Discharge status: Home / Self Care | Payer: BC Managed Care – PPO | Source: Ambulatory Visit | Attending: Obstetrics & Gynecology | Admitting: Obstetrics & Gynecology

## 2020-12-02 VITALS — BP 113/69 | HR 58 | Ht 61.0 in | Wt 157.0 lb

## 2020-12-02 DIAGNOSIS — Z01419 Encounter for gynecological examination (general) (routine) without abnormal findings: Secondary | ICD-10-CM

## 2020-12-02 NOTE — Progress Notes (Signed)
Subjective:    Patient ID: Sierra Haney, female    DOB: 30-Apr-1966, 55 y.o.   MRN: [id number]  Gynecologic Exam  Sierra Haney is a 55 y.o. female who presents to the [facility] for her annual Women's Health exam. She reports no problems other than weight gain during Covid that she has not been able to lose. She also reports hair loss after having Covid in Jan. 2000. She did have her Covid Vaccine in April 2000 and one booster. 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  Patient reports that she has not been sexually active "in a long time", that after menopause she had no desire. She lives with her husband and feels safe at home.    PCP   Past Medical History:  Diagnosis Date   Right bundle branch block    History reviewed. No pertinent surgical history. Social History   Socioeconomic History   Marital status: Married    Spouse name: Max   Number of children: 1   Years of education: Not on file   Highest education level: Not on file  Occupational History   Occupation: Engineer, manufacturing systems: [employer]  Tobacco Use   Smoking status: Never   Smokeless tobacco: Never  Vaping Use   Vaping Use: Never used  Substance and Sexual Activity   Alcohol use: No   Drug use: No   Sexual activity: Yes    Partners: Male    Birth control/protection: Post-menopausal  Other Topics Concern   Not on file  Social History Narrative   Not on file   Social Determinants of Health   Financial Resource Strain: Not on file  Food Insecurity: Not on file  Transportation Needs: Not on file  Physical Activity: Not on file  Stress: Not on file  Social Connections: Not on file  Intimate Partner Violence: Not on file   Current Outpatient Medications on File Prior to Visit  Medication Sig Dispense Refill   nitroGLYCERIN (NITROSTAT) 0.4 MG SL tablet Place 1 tablet (0.4 mg total) under the tongue every 5 (five) minutes as  needed for chest pain. (Patient not taking: Reported on 01/24/2017) 30 tablet 12   No current facility-administered medications on file prior to visit.   No Known Allergies  Review of Systems  Constitutional:        Weight gain during Covid and has not been able to lose it.   Cardiovascular:        Dx in ED years ago with Right BB block. F/u with stress test that was normal. No f/u since then.   All other systems reviewed and are negative.     Objective: BP 113/69   Pulse (!) 58   Ht 5\' 1"  (1.549 m)   Wt 157 lb (71.2 kg)   LMP 08/16/2009   BMI 29.66 kg/m     Physical Exam Vitals and nursing note reviewed. Exam conducted with a chaperone present.  Constitutional:      General: She is not in acute distress.    Appearance: Normal appearance.  HENT:     Head: Normocephalic and atraumatic.     Right Ear: External ear normal.     Left Ear: External ear normal.     Mouth/Throat:     Mouth: Mucous membranes are moist.  Eyes:     Conjunctiva/sclera: Conjunctivae normal.  Neck:     Thyroid: No thyroid mass or  thyroid tenderness.     Vascular: Normal carotid pulses. No carotid bruit or JVD.     Trachea: Trachea normal.  Cardiovascular:     Rate and Rhythm: Regular rhythm. Bradycardia present.     Pulses:          Carotid pulses are 2+ on the right side and 2+ on the left side.      Radial pulses are 2+ on the right side and 2+ on the left side.       Dorsalis pedis pulses are 2+ on the right side and 2+ on the left side.     Heart sounds: No murmur heard. Pulmonary:     Effort: Pulmonary effort is normal.     Breath sounds: Normal breath sounds and air entry.  Chest:     Chest wall: No mass, deformity or tenderness.  Breasts:    Right: Normal. No axillary adenopathy or supraclavicular adenopathy.     Left: Normal. No axillary adenopathy or supraclavicular adenopathy.  Abdominal:     General: Bowel sounds are normal. There is no abdominal bruit.     Palpations: Abdomen is  soft. There is no mass.     Tenderness: There is no abdominal tenderness. There is no right CVA tenderness or left CVA tenderness.  Genitourinary:    Labia:        Right: No rash, tenderness or lesion.        Left: No rash, tenderness or lesion.      Urethra: No prolapse, urethral pain or urethral lesion.     Vagina: Normal.     Cervix: Normal.     Uterus: Not tender and no uterine prolapse.      Adnexa: Right adnexa normal and left adnexa normal.  Musculoskeletal:     Cervical back: Neck supple. No pain with movement, spinous process tenderness or muscular tenderness.     Right lower leg: No edema.     Left lower leg: No edema.  Lymphadenopathy:     Cervical: No cervical adenopathy.     Upper Body:     Right upper body: No supraclavicular or axillary adenopathy.     Left upper body: No supraclavicular or axillary adenopathy.  Neurological:     Mental Status: She is alert.     Cranial Nerves: Cranial nerves are intact.     Motor: Motor function is intact.     Coordination: Coordination is intact.     Gait: Gait is intact.     Deep Tendon Reflexes:     Reflex Scores:      Bicep reflexes are 2+ on the right side and 2+ on the left side.      Brachioradialis reflexes are 2+ on the right side and 2+ on the left side.      Patellar reflexes are 2+ on the right side and 2+ on the left side.    Comments: Ambulatory with steady gait, stands on one foot without difficulty. Rapid alternating movement without difficulty. Grips are equal.   Psychiatric:        Mood and Affect: Mood normal.        Behavior: Behavior normal.      Assessment & Plan:  1. Encounter for well woman exam with routine gynecological exam - Cytology - PAP - MM 3D SCREEN BREAST BILATERAL; Future - CBC - TSH - Hemoglobin A1c - Comprehensive metabolic panel - Lipid panel - Ambulatory referral to Gastroenterology Discussed with the patient clinical findings and plan  of care. Patient given the opportunity to ask  questions. All questioned fully answered.

## 2020-12-02 NOTE — Patient Instructions (Signed)
Thank your for allowing me to be a part of your health care team today. Follow up with your primary care provider for your general health care and your heart recheck. You will need to have a mammogram and a colonoscopy. Return here in one year or sooner if needed.

## 2020-12-03 LAB — COMPREHENSIVE METABOLIC PANEL
ALT: 16 IU/L (ref 0–32)
AST: 16 IU/L (ref 0–40)
Albumin/Globulin Ratio: 2.2 (ref 1.2–2.2)
Albumin: 4.8 g/dL (ref 3.8–4.9)
Alkaline Phosphatase: 90 IU/L (ref 44–121)
BUN/Creatinine Ratio: 19 (ref 9–23)
BUN: 13 mg/dL (ref 6–24)
Bilirubin Total: 0.5 mg/dL (ref 0.0–1.2)
CO2: 24 mmol/L (ref 20–29)
Calcium: 9.5 mg/dL (ref 8.7–10.2)
Chloride: 101 mmol/L (ref 96–106)
Creatinine, Ser: 0.68 mg/dL (ref 0.57–1.00)
Globulin, Total: 2.2 g/dL (ref 1.5–4.5)
Glucose: 104 mg/dL — ABNORMAL HIGH (ref 65–99)
Potassium: 3.9 mmol/L (ref 3.5–5.2)
Sodium: 139 mmol/L (ref 134–144)
Total Protein: 7 g/dL (ref 6.0–8.5)
eGFR: 102 mL/min/{1.73_m2} (ref >59)

## 2020-12-03 LAB — LIPID PANEL
Chol/HDL Ratio: 2.9 ratio (ref 0.0–4.4)
Cholesterol, Total: 197 mg/dL (ref 100–199)
HDL: 68 mg/dL (ref >39)
LDL Chol Calc (NIH): 118 mg/dL — ABNORMAL HIGH (ref 0–99)
Triglycerides: 57 mg/dL (ref 0–149)
VLDL Cholesterol Cal: 11 mg/dL (ref 5–40)

## 2020-12-03 LAB — CBC
Hematocrit: 45.2 % (ref 34.0–46.6)
Hemoglobin: 15.3 g/dL (ref 11.1–15.9)
MCH: 29.6 pg (ref 26.6–33.0)
MCHC: 33.8 g/dL (ref 31.5–35.7)
MCV: 87 fL (ref 79–97)
Platelets: 165 10*3/uL (ref 150–450)
RBC: 5.17 x10E6/uL (ref 3.77–5.28)
RDW: 12.6 % (ref 11.7–15.4)
WBC: 6.1 10*3/uL (ref 3.4–10.8)

## 2020-12-03 LAB — HEMOGLOBIN A1C
Est. average glucose Bld gHb Est-mCnc: 111 mg/dL
Hgb A1c MFr Bld: 5.5 % (ref 4.8–5.6)

## 2020-12-03 LAB — TSH: TSH: 1.37 u[IU]/mL (ref 0.450–4.500)

## 2020-12-06 LAB — CYTOLOGY - PAP
Comment: NEGATIVE
Diagnosis: NEGATIVE
High risk HPV: NEGATIVE

## 2020-12-09 ENCOUNTER — Encounter: Payer: Self-pay | Admitting: Gastroenterology

## 2020-12-26 ENCOUNTER — Ambulatory Visit (AMBULATORY_SURGERY_CENTER): Payer: BC Managed Care – PPO

## 2020-12-26 VITALS — Ht 61.0 in | Wt 151.3 lb

## 2020-12-26 DIAGNOSIS — Z1211 Encounter for screening for malignant neoplasm of colon: Secondary | ICD-10-CM

## 2020-12-26 MED ORDER — PLENVU 140 G PO SOLR: 1.0000 | Freq: Once | ORAL | 0 refills | Status: AC

## 2020-12-26 NOTE — Progress Notes (Signed)
No egg or soy allergy known to patient  No issues with past sedation with any surgeries or procedures Patient denies ever being told they had issues or difficulty with intubation  No FH of Malignant Hyperthermia No diet pills per patient No home 02 use per patient  No blood thinners per patient  Pt denies issues with constipation  No A fib or A flutter  EMMI video to pt or via [place] 19 guidelines implemented in PV today with Pt and RN  Pt is fully vaccinated  for Covid   Virtual previsit  Plenvu Coupon given to pt in PV today , Code to Pharmacy and  NO PA's for preps discussed with pt In PV today  Discussed with pt there will be an out-of-pocket cost for prep and that varies from $0 to 70 dollars   Due to the COVID-19 pandemic we are asking patients to follow certain guidelines.  Pt aware of COVID protocols and [facility] guidelines

## 2021-01-05 ENCOUNTER — Ambulatory Visit (AMBULATORY_SURGERY_CENTER): Payer: BC Managed Care – PPO | Admitting: Gastroenterology

## 2021-01-05 ENCOUNTER — Encounter: Payer: Self-pay | Admitting: Gastroenterology

## 2021-01-05 ENCOUNTER — Telehealth: Payer: Self-pay | Admitting: Internal Medicine

## 2021-01-05 ENCOUNTER — Other Ambulatory Visit: Payer: Self-pay

## 2021-01-05 VITALS — BP 110/57 | HR 63 | Temp 97.5°F | Resp 16 | Ht 61.0 in | Wt 151.4 lb

## 2021-01-05 DIAGNOSIS — D122 Benign neoplasm of ascending colon: Secondary | ICD-10-CM

## 2021-01-05 DIAGNOSIS — Z1211 Encounter for screening for malignant neoplasm of colon: Secondary | ICD-10-CM

## 2021-01-05 MED ORDER — SODIUM CHLORIDE 0.9 % IV SOLN: 500.0000 mL | Freq: Once | INTRAVENOUS | Status: DC

## 2021-01-05 NOTE — Op Note (Signed)
[place] Patient Name: Sierra Haney Procedure Date: 01/05/2021 8:32 AM MRN: [id number] Endoscopist: Nicki Reaper E. Candis Schatz , MD Age: 55 Referring MD:  Date of Birth: 13-Dec-1965 Gender: Female Account #: [id number] Procedure:                Colonoscopy Indications:              Screening for colorectal malignant neoplasm, This                            is the patient's first colonoscopy Medicines:                Monitored Anesthesia Care Procedure:                Pre-Anesthesia Assessment:                           - Prior to the procedure, a History and Physical                            was performed, and patient medications and                            allergies were reviewed. The patient's tolerance of                            previous anesthesia was also reviewed. The risks                            and benefits of the procedure and the sedation                            options and risks were discussed with the patient.                            All questions were answered, and informed consent                            was obtained. Prior Anticoagulants: The patient has                            taken no previous anticoagulant or antiplatelet                            agents. ASA Grade Assessment: I - A normal, healthy                            patient. After reviewing the risks and benefits,                            the patient was deemed in satisfactory condition to                            undergo the procedure.  After obtaining informed consent, the colonoscope                            was passed under direct vision. Throughout the                            procedure, the patient's blood pressure, pulse, and                            oxygen saturations were monitored continuously. The                            CF [id number] #[id number] was introduced through the anus                            and advanced to the the terminal  ileum, with                            identification of the appendiceal orifice and IC                            valve. The colonoscopy was performed without                            difficulty. The patient tolerated the procedure                            well. The quality of the bowel preparation was                            good. The terminal ileum, ileocecal valve,                            appendiceal orifice, and rectum were photographed. Scope In: 8:48:14 AM Scope Out: 9:06:29 AM Scope Withdrawal Time: 0 hours 13 minutes 34 seconds  Total Procedure Duration: 0 hours 18 minutes 15 seconds  Findings:                 The perianal and digital rectal examinations were                            normal. Pertinent negatives include normal                            sphincter tone and no palpable rectal lesions.                           A 8 mm polyp was found in the ascending colon. The                            polyp was flat. The polyp was removed with a cold                            snare. Resection  and retrieval were complete.                            Estimated blood loss was minimal.                           A few small-mouthed diverticula were found in the                            sigmoid colon.                           The exam was otherwise normal throughout the                            examined colon.                           The terminal ileum appeared normal.                           The retroflexed view of the distal rectum and anal                            verge was normal and showed no anal or rectal                            abnormalities. Complications:            No immediate complications. Estimated Blood Loss:     Estimated blood loss was minimal. Impression:               - One 8 mm polyp in the ascending colon, removed                            with a cold snare. Resected and retrieved.                           - Diverticulosis in the  sigmoid colon.                           - The examined portion of the ileum was normal.                           - The distal rectum and anal verge are normal on                            retroflexion view. Recommendation:           - Patient has a contact number available for                            emergencies. The signs and symptoms of potential                            delayed complications were discussed with the  patient. Return to normal activities tomorrow.                            Written discharge instructions were provided to the                            patient.                           - Resume previous diet.                           - Continue present medications.                           - Await pathology results.                           - Repeat colonoscopy in 7 years for surveillance. Teodoro Jeffreys E. Candis Schatz, MD 01/05/2021 9:11:15 AM This report has been signed electronically.

## 2021-01-05 NOTE — Progress Notes (Signed)
Medical history reviewed with no changes noted. VS assessed by C.W 

## 2021-01-05 NOTE — Patient Instructions (Signed)
YOU HAD AN ENDOSCOPIC PROCEDURE TODAY AT THE [facility]:   Refer to the procedure report that was given to you for any specific questions about what was found during the examination.  If the procedure report does not answer your questions, please call your gastroenterologist to clarify.  If you requested that your care partner not be given the details of your procedure findings, then the procedure report has been included in a sealed envelope for you to review at your convenience later.  YOU SHOULD EXPECT: Some feelings of bloating in the abdomen. Passage of more gas than usual.  Walking can help get rid of the air that was put into your GI tract during the procedure and reduce the bloating. If you had a lower endoscopy (such as a colonoscopy or flexible sigmoidoscopy) you may notice spotting of blood in your stool or on the toilet paper. If you underwent a bowel prep for your procedure, you may not have a normal bowel movement for a few days.  Please Note:  You might notice some irritation and congestion in your nose or some drainage.  This is from the oxygen used during your procedure.  There is no need for concern and it should clear up in a day or so.  SYMPTOMS TO REPORT IMMEDIATELY:  Following lower endoscopy (colonoscopy or flexible sigmoidoscopy):  Excessive amounts of blood in the stool  Significant tenderness or worsening of abdominal pains  Swelling of the abdomen that is new, acute  Fever of 100F or higher   For urgent or emergent issues, a gastroenterologist can be reached at any hour by calling [phone]. Do not use MyChart messaging for urgent concerns.    DIET:  We do recommend a small meal at first, but then you may proceed to your regular diet.  Drink plenty of fluids but you should avoid alcoholic beverages for 24 hours.  MEDICATIONS:  Continue present medications.  Please see handouts given to you by your recovery nurse.  Thank you for allowing us to  provide for your healthcare needs today.  ACTIVITY:  You should plan to take it easy for the rest of today and you should NOT DRIVE or use heavy machinery until tomorrow (because of the sedation medicines used during the test).    FOLLOW UP: Our staff will call the number listed on your records 48-72 hours following your procedure to check on you and address any questions or concerns that you may have regarding the information given to you following your procedure. If we do not reach you, we will leave a message.  We will attempt to reach you two times.  During this call, we will ask if you have developed any symptoms of COVID 19. If you develop any symptoms (ie: fever, flu-like symptoms, shortness of breath, cough etc.) before then, please call [phone].  If you test positive for Covid 19 in the 2 weeks post procedure, please call and report this information to us.    If any biopsies were taken you will be contacted by phone or by letter within the next 1-3 weeks.  Please call us at [phone] if you have not heard about the biopsies in 3 weeks.    SIGNATURES/CONFIDENTIALITY: You and/or your care partner have signed paperwork which will be entered into your electronic medical record.  These signatures attest to the fact that that the information above on your After Visit Summary has been reviewed and is understood.  Full responsibility of the   confidentiality of this discharge information lies with you and/or your care-partner.  

## 2021-01-05 NOTE — Progress Notes (Signed)
Called to room to assist during endoscopic procedure.  Patient ID and intended procedure confirmed with present staff. Received instructions for my participation in the procedure from the performing physician.  

## 2021-01-05 NOTE — Progress Notes (Signed)
To PACU, VSS. Report to Rn.tb 

## 2021-01-05 NOTE — Telephone Encounter (Signed)
Bad headache  Nausea and vomiting  No focal weakness   No abdominal pain, fever  No visual disturbance   Vomited a fair amount of liquid no bloody vomitus   Perhaps a remote history of headaches but no chronic headaches and takes no medications at home she took acetaminophen a while ago with no benefit   I have recommended she go for an emergency department evaluation and treatment.  I do not think I can help her over the phone perhaps this is related to dehydration or something but needs further evaluation and treatment her son whom I spoke to acknowledged and will go to [place]

## 2021-01-06 ENCOUNTER — Emergency Department (HOSPITAL_COMMUNITY)
Admission: EM | Admit: 2021-01-06 | Discharge: 2021-01-06 | Disposition: A | Discharge status: Home / Self Care | Payer: BC Managed Care – PPO | Attending: Emergency Medicine | Admitting: Emergency Medicine

## 2021-01-06 ENCOUNTER — Encounter (HOSPITAL_COMMUNITY): Payer: Self-pay | Admitting: Emergency Medicine

## 2021-01-06 DIAGNOSIS — R112 Nausea with vomiting, unspecified: Secondary | ICD-10-CM | POA: Insufficient documentation

## 2021-01-06 DIAGNOSIS — R519 Headache, unspecified: Secondary | ICD-10-CM | POA: Diagnosis not present

## 2021-01-06 DIAGNOSIS — R110 Nausea: Secondary | ICD-10-CM

## 2021-01-06 LAB — CBC WITH DIFFERENTIAL/PLATELET
Abs Immature Granulocytes: 0.03 10*3/uL (ref 0.00–0.07)
Basophils Absolute: 0 10*3/uL (ref 0.0–0.1)
Basophils Relative: 0 %
Eosinophils Absolute: 0 10*3/uL (ref 0.0–0.5)
Eosinophils Relative: 0 %
HCT: 46.2 % — ABNORMAL HIGH (ref 36.0–46.0)
Hemoglobin: 15.4 g/dL — ABNORMAL HIGH (ref 12.0–15.0)
Immature Granulocytes: 0 %
Lymphocytes Relative: 10 %
Lymphs Abs: 1 10*3/uL (ref 0.7–4.0)
MCH: 29.6 pg (ref 26.0–34.0)
MCHC: 33.3 g/dL (ref 30.0–36.0)
MCV: 88.7 fL (ref 80.0–100.0)
Monocytes Absolute: 0.4 10*3/uL (ref 0.1–1.0)
Monocytes Relative: 4 %
Neutro Abs: 8.1 10*3/uL — ABNORMAL HIGH (ref 1.7–7.7)
Neutrophils Relative %: 86 %
Platelets: 225 10*3/uL (ref 150–400)
RBC: 5.21 MIL/uL — ABNORMAL HIGH (ref 3.87–5.11)
RDW: 12.8 % (ref 11.5–15.5)
WBC: 9.5 10*3/uL (ref 4.0–10.5)
nRBC: 0 % (ref 0.0–0.2)

## 2021-01-06 LAB — BASIC METABOLIC PANEL
Anion gap: 12 (ref 5–15)
BUN: 16 mg/dL (ref 6–20)
CO2: 24 mmol/L (ref 22–32)
Calcium: 9.8 mg/dL (ref 8.9–10.3)
Chloride: 105 mmol/L (ref 98–111)
Creatinine, Ser: 0.54 mg/dL (ref 0.44–1.00)
GFR, Estimated: >60 mL/min (ref >60)
Glucose, Bld: 72 mg/dL (ref 70–99)
Potassium: 4 mmol/L (ref 3.5–5.1)
Sodium: 141 mmol/L (ref 135–145)

## 2021-01-06 MED ORDER — ONDANSETRON 4 MG PO TBDP: 4.0000 mg | ORAL_TABLET | Freq: Three times a day (TID) | ORAL | 0 refills | Status: AC | PRN
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
## 2021-01-06 NOTE — Discharge Instructions (Addendum)
It was a pleasure taking care of you! You came in for nausea and vomiting, and I am glad you are feeling better. I am prescribing Zofran to use as needed for your nausea. Return if symptoms worsen and you are unable to keep anything down even after taking your Zofran

## 2021-01-06 NOTE — ED Notes (Signed)
Introduced self to pt. Pt reports after taking zofran this morning, she is no experiencing any more nausea, vomiting, or headache. No complaints at this time.

## 2021-01-06 NOTE — ED Triage Notes (Signed)
Pt sent from [facility]. Pt reports migraines and n/v since her colonoscopy prep. Pt states she was given zofran.

## 2021-01-06 NOTE — ED Provider Notes (Signed)
Emergency Medicine Provider Triage Evaluation Note  Cleda Hiltunen , a 55 y.o. female  was evaluated in triage.  Pt complains of migraine. Started after her colonoscopy yesterday. She is nauseated and having multiple episodes of emesis. Tried tylenol without relief. Seen at UC who gave zofran, which alleviated the nausea, but they sent her here for further evaluation. Assoc. Photophobia, no history of migraines, no head trauma  Review of Systems  Positive: Headache, nausea, vomiting, photophobia Negative: Head trauma, loss of consciousness, vision changes  Physical Exam  BP 124/73   Pulse 74   Temp 98.5 F (36.9 C) (Oral)   Resp 16   LMP 08/16/2009   SpO2 100%  Gen:   Awake, no distress   Resp:  Normal effort  MSK:   Moves extremities without difficulty  Other:  Cranial nerves II through XII are grossly intact.  Grip strength equal bilaterally.  Medical Decision Making  Medically screening exam initiated at 2:19 PM.  Appropriate orders placed.  Xavia Herget was informed that the remainder of the evaluation will be completed by another provider, this initial triage assessment does not replace that evaluation, and the importance of remaining in the ED until their evaluation is complete.     Sherrill Raring, PA-C 01/06/21 1421    Pattricia Boss, MD 01/15/21 [phone]

## 2021-01-09 ENCOUNTER — Telehealth: Payer: Self-pay

## 2021-01-09 NOTE — Telephone Encounter (Signed)
  Follow up Call-  Call back number 01/05/2021  Post procedure Call Back phone  # [phone]  Permission to leave phone message Yes  Some recent data might be hidden     2nd follow up call made.  NALM

## 2021-01-09 NOTE — Telephone Encounter (Signed)
  Follow up Call-  Call back number 01/05/2021  Post procedure Call Back phone  # [phone]  Permission to leave phone message Yes  Some recent data might be hidden    1st follow up call made.  NALM

## 2021-01-10 ENCOUNTER — Encounter: Payer: Self-pay | Admitting: Gastroenterology

## 2021-01-25 ENCOUNTER — Other Ambulatory Visit: Payer: Self-pay

## 2021-01-25 ENCOUNTER — Ambulatory Visit
Admission: RE | Admit: 2021-01-25 | Discharge: 2021-01-25 | Disposition: A | Discharge status: Home / Self Care | Payer: BC Managed Care – PPO | Source: Ambulatory Visit | Attending: Nurse Practitioner | Admitting: Nurse Practitioner

## 2021-01-25 DIAGNOSIS — Z01419 Encounter for gynecological examination (general) (routine) without abnormal findings: Secondary | ICD-10-CM

## 2021-10-19 ENCOUNTER — Other Ambulatory Visit: Payer: Self-pay | Admitting: Nurse Practitioner

## 2021-10-19 DIAGNOSIS — Z1231 Encounter for screening mammogram for malignant neoplasm of breast: Secondary | ICD-10-CM

## 2022-01-29 ENCOUNTER — Ambulatory Visit
Admission: RE | Admit: 2022-01-29 | Discharge: 2022-01-29 | Disposition: A | Discharge status: Home / Self Care | Payer: BC Managed Care – PPO | Source: Ambulatory Visit

## 2022-01-29 DIAGNOSIS — Z1231 Encounter for screening mammogram for malignant neoplasm of breast: Secondary | ICD-10-CM

## 2022-11-29 DIAGNOSIS — Z1231 Encounter for screening mammogram for malignant neoplasm of breast: Secondary | ICD-10-CM

## 2022-12-18 ENCOUNTER — Ambulatory Visit (INDEPENDENT_AMBULATORY_CARE_PROVIDER_SITE_OTHER): Payer: BC Managed Care – PPO | Admitting: Obstetrics and Gynecology

## 2022-12-18 ENCOUNTER — Encounter: Payer: Self-pay | Admitting: Obstetrics and Gynecology

## 2022-12-18 VITALS — BP 119/72 | HR 62 | Wt 160.4 lb

## 2022-12-18 DIAGNOSIS — Z0000 Encounter for general adult medical examination without abnormal findings: Secondary | ICD-10-CM | POA: Diagnosis not present

## 2022-12-18 NOTE — Progress Notes (Signed)
Patient presents for Annual.  LMP: postmenopausal  Last pap: Date: 12/02/20 Contraception: Post-menopausal Mammogram: Due, last mammogram: 01/29/22 , scheduled for next month.  STD Screening: not indicated Flu Vaccine : N/A  CC:  Annual   Fun Fact: Left ear strange noise, weight loss ?, place on right side of forehead
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
## 2022-12-19 LAB — COMPREHENSIVE METABOLIC PANEL
ALT: 14 IU/L (ref 0–32)
AST: 18 IU/L (ref 0–40)
Albumin: 4.5 g/dL (ref 3.8–4.9)
Alkaline Phosphatase: 75 IU/L (ref 44–121)
BUN/Creatinine Ratio: 14 (ref 9–23)
BUN: 8 mg/dL (ref 6–24)
Bilirubin Total: 0.5 mg/dL (ref 0.0–1.2)
CO2: 23 mmol/L (ref 20–29)
Calcium: 9.7 mg/dL (ref 8.7–10.2)
Chloride: 99 mmol/L (ref 96–106)
Creatinine, Ser: 0.59 mg/dL (ref 0.57–1.00)
Globulin, Total: 2.5 g/dL (ref 1.5–4.5)
Glucose: 94 mg/dL (ref 70–99)
Potassium: 4.3 mmol/L (ref 3.5–5.2)
Sodium: 138 mmol/L (ref 134–144)
Total Protein: 7 g/dL (ref 6.0–8.5)
eGFR: 104 mL/min/{1.73_m2} (ref >59)

## 2022-12-19 LAB — LIPID PANEL
Chol/HDL Ratio: 3.4 ratio (ref 0.0–4.4)
Cholesterol, Total: 203 mg/dL — ABNORMAL HIGH (ref 100–199)
HDL: 59 mg/dL (ref >39)
LDL Chol Calc (NIH): 131 mg/dL — ABNORMAL HIGH (ref 0–99)
Triglycerides: 74 mg/dL (ref 0–149)
VLDL Cholesterol Cal: 13 mg/dL (ref 5–40)

## 2022-12-19 LAB — CBC
Hematocrit: 42.4 % (ref 34.0–46.6)
Hemoglobin: 14.7 g/dL (ref 11.1–15.9)
MCH: 29.8 pg (ref 26.6–33.0)
MCHC: 34.7 g/dL (ref 31.5–35.7)
MCV: 86 fL (ref 79–97)
Platelets: 144 10*3/uL — ABNORMAL LOW (ref 150–450)
RBC: 4.93 x10E6/uL (ref 3.77–5.28)
RDW: 12.3 % (ref 11.7–15.4)
WBC: 4.9 10*3/uL (ref 3.4–10.8)

## 2022-12-19 LAB — URINALYSIS, ROUTINE W REFLEX MICROSCOPIC
Bilirubin, UA: NEGATIVE
Glucose, UA: NEGATIVE
Ketones, UA: NEGATIVE
Nitrite, UA: NEGATIVE
Protein,UA: NEGATIVE
RBC, UA: NEGATIVE
Specific Gravity, UA: <=1.005 — AB (ref 1.005–1.030)
Urobilinogen, Ur: 0.2 mg/dL (ref 0.2–1.0)
pH, UA: 7 (ref 5.0–7.5)

## 2022-12-19 LAB — MICROSCOPIC EXAMINATION
Bacteria, UA: NONE SEEN
Casts: NONE SEEN /lpf
Epithelial Cells (non renal): NONE SEEN /hpf (ref 0–10)
RBC, Urine: NONE SEEN /hpf (ref 0–2)
WBC, UA: NONE SEEN /hpf (ref 0–5)

## 2022-12-19 LAB — VITAMIN D 25 HYDROXY (VIT D DEFICIENCY, FRACTURES): Vit D, 25-Hydroxy: 25.3 ng/mL — ABNORMAL LOW (ref 30.0–100.0)

## 2022-12-19 LAB — TSH RFX ON ABNORMAL TO FREE T4: TSH: 1.68 u[IU]/mL (ref 0.450–4.500)

## 2022-12-19 LAB — HEMOGLOBIN A1C
Est. average glucose Bld gHb Est-mCnc: 123 mg/dL
Hgb A1c MFr Bld: 5.9 % — ABNORMAL HIGH (ref 4.8–5.6)

## 2023-01-09 ENCOUNTER — Other Ambulatory Visit: Payer: Self-pay | Admitting: Nurse Practitioner

## 2023-01-09 ENCOUNTER — Other Ambulatory Visit: Payer: Self-pay | Admitting: Dental General Practice

## 2023-01-09 ENCOUNTER — Other Ambulatory Visit: Payer: Self-pay | Admitting: Family Medicine

## 2023-01-09 DIAGNOSIS — Z1231 Encounter for screening mammogram for malignant neoplasm of breast: Secondary | ICD-10-CM

## 2023-01-15 ENCOUNTER — Encounter: Payer: Self-pay | Admitting: Obstetrics and Gynecology

## 2023-01-15 DIAGNOSIS — R7989 Other specified abnormal findings of blood chemistry: Secondary | ICD-10-CM | POA: Insufficient documentation

## 2023-01-15 DIAGNOSIS — E7800 Pure hypercholesterolemia, unspecified: Secondary | ICD-10-CM | POA: Insufficient documentation

## 2023-01-15 DIAGNOSIS — R7303 Prediabetes: Secondary | ICD-10-CM | POA: Insufficient documentation

## 2023-01-31 ENCOUNTER — Ambulatory Visit
Admission: RE | Admit: 2023-01-31 | Discharge: 2023-01-31 | Disposition: A | Discharge status: Home / Self Care | Payer: BC Managed Care – PPO | Source: Ambulatory Visit

## 2023-01-31 DIAGNOSIS — Z1231 Encounter for screening mammogram for malignant neoplasm of breast: Secondary | ICD-10-CM

## 2023-12-31 ENCOUNTER — Other Ambulatory Visit: Payer: Self-pay | Admitting: Family Medicine

## 2023-12-31 DIAGNOSIS — Z1231 Encounter for screening mammogram for malignant neoplasm of breast: Secondary | ICD-10-CM

## 2024-01-22 ENCOUNTER — Encounter

## 2024-01-22 DIAGNOSIS — Z1231 Encounter for screening mammogram for malignant neoplasm of breast: Secondary | ICD-10-CM

## 2024-02-03 ENCOUNTER — Ambulatory Visit
Admission: RE | Admit: 2024-02-03 | Discharge: 2024-02-03 | Disposition: A | Discharge status: Home / Self Care | Source: Ambulatory Visit | Attending: Family Medicine | Admitting: Family Medicine

## 2024-02-03 ENCOUNTER — Ambulatory Visit

## 2024-02-03 DIAGNOSIS — Z1231 Encounter for screening mammogram for malignant neoplasm of breast: Secondary | ICD-10-CM
